# Patient Record
Sex: Female | Born: 1970
Health system: Southern US, Community
[De-identification: ages and names within clinical notes are randomized; demographics above are authoritative.]

## PROBLEM LIST (undated history)

## (undated) HISTORY — PX: KNEE SURGERY: SHX244

## (undated) HISTORY — PX: WRIST SURGERY: SHX841

## (undated) HISTORY — PX: KNEE ARTHROSCOPY W/ ACL RECONSTRUCTION: SHX1858

---

## 2003-12-28 ENCOUNTER — Emergency Department (HOSPITAL_COMMUNITY): Admission: EM | Admit: 2003-12-28 | Discharge: 2003-12-28 | Payer: Self-pay | Admitting: Emergency Medicine

## 2003-12-29 ENCOUNTER — Ambulatory Visit (HOSPITAL_COMMUNITY): Admission: RE | Admit: 2003-12-29 | Discharge: 2003-12-29 | Payer: Self-pay | Admitting: Orthopedic Surgery

## 2003-12-31 ENCOUNTER — Ambulatory Visit (HOSPITAL_BASED_OUTPATIENT_CLINIC_OR_DEPARTMENT_OTHER): Admission: RE | Admit: 2003-12-31 | Discharge: 2003-12-31 | Payer: Self-pay | Admitting: Orthopedic Surgery

## 2004-04-28 ENCOUNTER — Ambulatory Visit (HOSPITAL_BASED_OUTPATIENT_CLINIC_OR_DEPARTMENT_OTHER): Admission: RE | Admit: 2004-04-28 | Discharge: 2004-04-28 | Payer: Self-pay | Admitting: Orthopedic Surgery

## 2004-05-26 ENCOUNTER — Ambulatory Visit (HOSPITAL_BASED_OUTPATIENT_CLINIC_OR_DEPARTMENT_OTHER): Admission: RE | Admit: 2004-05-26 | Discharge: 2004-05-26 | Payer: Self-pay | Admitting: Orthopedic Surgery

## 2005-08-24 ENCOUNTER — Encounter: Admission: RE | Admit: 2005-08-24 | Discharge: 2005-08-24 | Payer: Self-pay | Admitting: Specialist

## 2005-09-24 ENCOUNTER — Encounter: Admission: RE | Admit: 2005-09-24 | Discharge: 2005-11-06 | Payer: Self-pay | Admitting: Specialist

## 2005-12-24 ENCOUNTER — Ambulatory Visit (HOSPITAL_COMMUNITY): Admission: RE | Admit: 2005-12-24 | Discharge: 2005-12-24 | Payer: Self-pay | Admitting: Specialist

## 2012-11-17 ENCOUNTER — Other Ambulatory Visit: Payer: Self-pay | Admitting: Nurse Practitioner

## 2012-11-17 MED ORDER — SCOPOLAMINE 1 MG/3DAYS TD PT72: 1.0000 | MEDICATED_PATCH | TRANSDERMAL | Status: DC

## 2013-06-29 ENCOUNTER — Ambulatory Visit (INDEPENDENT_AMBULATORY_CARE_PROVIDER_SITE_OTHER): Payer: BC Managed Care – PPO | Admitting: Family Medicine

## 2013-06-29 ENCOUNTER — Encounter: Payer: Self-pay | Admitting: Family Medicine

## 2013-06-29 ENCOUNTER — Ambulatory Visit (INDEPENDENT_AMBULATORY_CARE_PROVIDER_SITE_OTHER): Payer: BC Managed Care – PPO

## 2013-06-29 VITALS — BP 113/75 | HR 94 | Temp 96.3°F | Ht 63.25 in | Wt 167.4 lb

## 2013-06-29 DIAGNOSIS — M25579 Pain in unspecified ankle and joints of unspecified foot: Secondary | ICD-10-CM

## 2013-06-29 MED ORDER — NAPROXEN 500 MG PO TABS: 500.0000 mg | ORAL_TABLET | Freq: Two times a day (BID) | ORAL | Status: DC

## 2013-06-29 NOTE — Progress Notes (Signed)
   Subjective:    Patient ID: Jamie Odonnell, female    DOB: 1970-03-16, 43 y.o.   MRN: [id number]  HPI This 43 y.o. female presents for evaluation of left injury which occurred 3 days ago when she  Was jogging and turned her foot in and she heard a pop and then had pain and difficulty walking On her left foot.  She has swelling and tenderness in her foot.   Review of Systems C/o left foot pain   No chest pain, SOB, HA, dizziness, vision change, N/V, diarrhea, constipation, dysuria, urinary urgency or frequency, myalgias, arthralgias or rash.  Objective:   Physical Exam  Left Foot - Pedal edema and TTP over 5th metatarsal and lateral anterior foot.  Mildly limited Dorsiflexion and plantar flexion, eversion, and inversion intact.  Patient ambulates w/o difficulty.      Xray of left foot -  No fracture Assessment & Plan:  Pain in joint, ankle and foot - Plan: DG Foot Complete Left, naproxen (NAPROSYN) 500 MG tablet Po bid x 10 days.   Follow up if not better.  Discussed probably just sprain or tendonitis.  Deatra CanterWilliam J Oxford FNP

## 2014-06-09 ENCOUNTER — Telehealth: Payer: Self-pay | Admitting: Family Medicine

## 2014-06-09 MED ORDER — SCOPOLAMINE 1 MG/3DAYS TD PT72: 1.0000 | MEDICATED_PATCH | TRANSDERMAL | Status: DC

## 2014-06-09 NOTE — Telephone Encounter (Signed)
Last seen by Ander SladeBill Oxford 06/2013. Would you authorize a prescription, does she need to be seen, or would you suggest OTC med?

## 2014-06-28 ENCOUNTER — Ambulatory Visit (INDEPENDENT_AMBULATORY_CARE_PROVIDER_SITE_OTHER): Payer: BLUE CROSS/BLUE SHIELD | Admitting: Family

## 2014-06-28 ENCOUNTER — Encounter: Payer: Self-pay | Admitting: Family

## 2014-06-28 VITALS — BP 130/84 | HR 73 | Temp 98.1°F | Ht 63.25 in | Wt 176.4 lb

## 2014-06-28 DIAGNOSIS — R05 Cough: Secondary | ICD-10-CM | POA: Diagnosis not present

## 2014-06-28 DIAGNOSIS — J309 Allergic rhinitis, unspecified: Secondary | ICD-10-CM | POA: Diagnosis not present

## 2014-06-28 DIAGNOSIS — R059 Cough, unspecified: Secondary | ICD-10-CM

## 2014-06-28 MED ORDER — BENZONATATE 200 MG PO CAPS: 200.0000 mg | ORAL_CAPSULE | Freq: Three times a day (TID) | ORAL | Status: DC | PRN

## 2014-06-28 MED ORDER — LORATADINE 10 MG PO TABS: 10.0000 mg | ORAL_TABLET | Freq: Every day | ORAL | Status: DC

## 2014-06-28 NOTE — Progress Notes (Signed)
Subjective:    Patient ID: Jamie Odonnell, female    DOB: 06-02-1970, 44 y.o.   MRN: [id number]  Cough Associated symptoms include shortness of breath. Pertinent negatives include no chills, ear pain, headaches or sore throat.  Sinusitis This is a new problem. The current episode started more than 1 month ago. The problem is unchanged. There has been no fever. Her pain is at a severity of 4/10. The pain is mild. Associated symptoms include congestion, coughing, a hoarse voice, shortness of breath and sneezing. Pertinent negatives include no chills, ear pain, headaches, sinus pressure or sore throat. Past treatments include oral decongestants (Zytec). The treatment provided mild relief.      Review of Systems  Constitutional: Negative.  Negative for chills.  HENT: Positive for congestion, hoarse voice and sneezing. Negative for ear pain, sinus pressure and sore throat.   Eyes: Negative.   Respiratory: Positive for cough and shortness of breath.   Cardiovascular: Negative.  Negative for palpitations.  Gastrointestinal: Negative.   Endocrine: Negative.   Genitourinary: Negative.   Musculoskeletal: Negative.   Neurological: Negative.  Negative for headaches.  Hematological: Negative.   Psychiatric/Behavioral: Negative.   All other systems reviewed and are negative.      Objective:   Physical Exam  Constitutional: She is oriented to person, place, and time. She appears well-developed and well-nourished. No distress.  HENT:  Head: Normocephalic and atraumatic.  Right Ear: External ear normal.  Left Ear: External ear normal.  Nasal passage erythemas with mild swelling    Eyes: Pupils are equal, round, and reactive to light.  Neck: Normal range of motion. Neck supple. No thyromegaly present.  Cardiovascular: Normal rate, regular rhythm, normal heart sounds and intact distal pulses.   No murmur heard. Pulmonary/Chest: Effort normal and breath sounds normal. No respiratory distress.  She has no wheezes.  Abdominal: Soft. Bowel sounds are normal. She exhibits no distension. There is no tenderness.  Musculoskeletal: Normal range of motion. She exhibits no edema or tenderness.  Neurological: She is alert and oriented to person, place, and time. She has normal reflexes. No cranial nerve deficit.  Skin: Skin is warm and dry.  Psychiatric: She has a normal mood and affect. Her behavior is normal. Judgment and thought content normal.  Vitals reviewed.     BP 130/84 mmHg  Pulse 73  Temp(Src) 98.1 F (36.7 C) (Oral)  Ht 5' 3.25" (1.607 m)  Wt 176 lb 6.4 oz (80.015 kg)  BMI 30.98 kg/m2  LMP 06/01/2014     Assessment & Plan:  1. Allergic rhinitis, unspecified allergic rhinitis type -- Take meds as prescribed - Use a cool mist humidifier  -Use saline nose sprays frequently -Saline irrigations of the nose can be very helpful if done frequently.  * 4X daily for 1 week*  * Use of a nettie pot can be helpful with this. Follow directions with this* -Force fluids -For any cough or congestion  Use plain Mucinex- regular strength or max strength is fine   * Children- consult with Pharmacist for dosing -For fever or aces or pains- take tylenol or ibuprofen appropriate for age and weight.  * for fevers greater than 101 orally you may alternate ibuprofen and tylenol every  3 hours. -Throat lozenges if help - loratadine (CLARITIN) 10 MG tablet; Take 1 tablet (10 mg total) by mouth daily.  Dispense: 90 tablet; Refill: 1  2. Cough - benzonatate (TESSALON) 200 MG capsule; Take 1 capsule (200 mg total) by mouth  3 (three) times daily as needed.  Dispense: 30 capsule; Refill: 1  Jannifer Rodney, FNP

## 2014-06-28 NOTE — Patient Instructions (Signed)
Allergic Rhinitis Allergic rhinitis is when the mucous membranes in the nose respond to allergens. Allergens are particles in the air that cause your body to have an allergic reaction. This causes you to release allergic antibodies. Through a chain of events, these eventually cause you to release histamine into the blood stream. Although meant to protect the body, it is this release of histamine that causes your discomfort, such as frequent sneezing, congestion, and an itchy, runny nose.  CAUSES  Seasonal allergic rhinitis (hay fever) is caused by pollen allergens that may come from grasses, trees, and weeds. Year-round allergic rhinitis (perennial allergic rhinitis) is caused by allergens such as house dust mites, pet dander, and mold spores.  SYMPTOMS   Nasal stuffiness (congestion).  Itchy, runny nose with sneezing and tearing of the eyes. DIAGNOSIS  Your health care provider can help you determine the allergen or allergens that trigger your symptoms. If you and your health care provider are unable to determine the allergen, skin or blood testing may be used. TREATMENT  Allergic rhinitis does not have a cure, but it can be controlled by:  Medicines and allergy shots (immunotherapy).  Avoiding the allergen. Hay fever may often be treated with antihistamines in pill or nasal spray forms. Antihistamines block the effects of histamine. There are over-the-counter medicines that may help with nasal congestion and swelling around the eyes. Check with your health care provider before taking or giving this medicine.  If avoiding the allergen or the medicine prescribed do not work, there are many new medicines your health care provider can prescribe. Stronger medicine may be used if initial measures are ineffective. Desensitizing injections can be used if medicine and avoidance does not work. Desensitization is when a patient is given ongoing shots until the body becomes less sensitive to the allergen.  Make sure you follow up with your health care provider if problems continue. HOME CARE INSTRUCTIONS It is not possible to completely avoid allergens, but you can reduce your symptoms by taking steps to limit your exposure to them. It helps to know exactly what you are allergic to so that you can avoid your specific triggers. SEEK MEDICAL CARE IF:   You have a fever.  You develop a cough that does not stop easily (persistent).  You have shortness of breath.  You start wheezing.  Symptoms interfere with normal daily activities. Document Released: 11/21/2000 Document Revised: 03/03/2013 Document Reviewed: 11/03/2012 Exit[employer] Patient Information 2015 [employer]. This information is not intended to replace advice given to you by your health care provider. Make sure you discuss any questions you have with your health care provider.  - Take meds as prescribed - Use a cool mist humidifier  -Use saline nose sprays frequently -Saline irrigations of the nose can be very helpful if done frequently.  * 4X daily for 1 week*  * Use of a nettie pot can be helpful with this. Follow directions with this* -Force fluids -For any cough or congestion  Use plain Mucinex- regular strength or max strength is fine   * Children- consult with Pharmacist for dosing -For fever or aces or pains- take tylenol or ibuprofen appropriate for age and weight.  * for fevers greater than 101 orally you may alternate ibuprofen and tylenol every  3 hours. -Throat lozenges if help   Micaylah Bertucci, FNP  

## 2015-05-12 ENCOUNTER — Ambulatory Visit: Payer: BLUE CROSS/BLUE SHIELD | Admitting: Family Medicine

## 2015-05-13 ENCOUNTER — Encounter: Payer: Self-pay | Admitting: Family Medicine

## 2015-11-16 ENCOUNTER — Encounter: Payer: Self-pay | Admitting: Family Medicine

## 2015-11-16 ENCOUNTER — Ambulatory Visit (INDEPENDENT_AMBULATORY_CARE_PROVIDER_SITE_OTHER): Payer: BLUE CROSS/BLUE SHIELD | Admitting: Family Medicine

## 2015-11-16 DIAGNOSIS — S90869A Insect bite (nonvenomous), unspecified foot, initial encounter: Secondary | ICD-10-CM | POA: Insufficient documentation

## 2015-11-16 DIAGNOSIS — W57XXXA Bitten or stung by nonvenomous insect and other nonvenomous arthropods, initial encounter: Secondary | ICD-10-CM | POA: Diagnosis not present

## 2015-11-16 DIAGNOSIS — S90862A Insect bite (nonvenomous), left foot, initial encounter: Secondary | ICD-10-CM

## 2015-11-16 MED ORDER — FLUOCINONIDE-E 0.05 % EX CREA: 1.0000 "application " | TOPICAL_CREAM | Freq: Two times a day (BID) | CUTANEOUS | 5 refills | Status: DC

## 2015-11-16 MED ORDER — DOXYCYCLINE HYCLATE 100 MG PO CAPS: 100.0000 mg | ORAL_CAPSULE | Freq: Two times a day (BID) | ORAL | 0 refills | Status: DC

## 2015-11-16 NOTE — Progress Notes (Signed)
Subjective:  Patient ID: Jamie Odonnell, female    DOB: 1970/04/22  Age: 45 y.o. MRN: [id number]  CC: Insect Bite (pt here today b/c of 2 insect bites on left ankle and left foot that is reddened, raised and itches)   HPI Jamie Odonnell presents for First noticed 2 lesions on the left foot 6 days ago. They were pruritic. After scratching they seem to grow. There were tender but not painful. Her main concern is that they aren't infected and although they are somewhat better than they were a day or 2 ago these are still rather large on her left foot. She denies any fever chills or sweats.   History Jamie PikesSusan has no past medical history on file.   She has a past surgical history that includes Cesarean section; Knee surgery (Left); Knee arthroscopy w/ ACL reconstruction (Left); and Wrist surgery.   Her family history is not on file.She reports that she has never smoked. She has never used smokeless tobacco. She reports that she does not drink alcohol or use drugs.    ROS Review of Systems  Constitutional: Negative for appetite change, chills, diaphoresis, fatigue and fever.  HENT: Negative for congestion and sore throat.   Respiratory: Negative for cough.   Cardiovascular: Negative for palpitations.  Gastrointestinal: Negative for abdominal pain.  Musculoskeletal: Negative for arthralgias.  Skin: Positive for rash.    Objective:  BP 120/69   Pulse 64   Temp 97.7 F (36.5 C) (Oral)   Ht 5' 3.25" (1.607 m)   Wt 175 lb 6 oz (79.5 kg)   BMI 30.82 kg/m   BP Readings from Last 3 Encounters:  11/16/15 120/69  06/28/14 130/84  06/29/13 113/75    Wt Readings from Last 3 Encounters:  11/16/15 175 lb 6 oz (79.5 kg)  06/28/14 176 lb 6.4 oz (80 kg)  06/29/13 167 lb 6.4 oz (75.9 kg)     Physical Exam  Constitutional: She is oriented to person, place, and time. She appears well-developed and well-nourished. No distress.  HENT:  Head: Normocephalic.  Neck: Normal range of motion.    Cardiovascular: Normal rate and regular rhythm.   Neurological: She is alert and oriented to person, place, and time.  Skin: Skin is warm and dry. Rash (2 separate lesi the left foot one close to the ankle is 4 cm in diameter. One in the dorsal lateral midfoot is 3-1/2 cm. No thorough violaceous. There is a central bleb. There is blanching.) noted.  Psychiatric: She has a normal mood and affect.     No results found for: WBC, HGB, HCT, PLT, GLUCOSE, CHOL, TRIG, HDL, LDLDIRECT, LDLCALC, ALT, AST, NA, K, CL, CREATININE, BUN, CO2, TSH, PSA, INR, GLUF, HGBA1C, MICROALBUR  No results found.  Assessment & Plan:   Jamie PikesSusan was seen today for insect bite.  Diagnoses and all orders for this visit:  Insect bite of foot with local reaction, left, initial encounter  Other orders -     doxycycline (VIBRAMYCIN) 100 MG capsule; Take 1 capsule (100 mg total) by mouth 2 (two) times daily. -     fluocinonide-emollient (LIDEX-E) 0.05 % cream; Apply 1 application topically 2 (two) times daily. To affected areas      I have discontinued Ms. Mcculloh's norgestimate-ethinyl estradiol, Norethindrone-Ethinyl Estradiol-Fe Biphas, and benzonatate. I am also having her start on doxycycline and fluocinonide-emollient. Additionally, I am having her maintain her loratadine and JUNEL 1/20.  Meds ordered this encounter  Medications  . JUNEL 1/20  1-20 MG-MCG tablet  . doxycycline (VIBRAMYCIN) 100 MG capsule    Sig: Take 1 capsule (100 mg total) by mouth 2 (two) times daily.    Dispense:  10 capsule    Refill:  0  . fluocinonide-emollient (LIDEX-E) 0.05 % cream    Sig: Apply 1 application topically 2 (two) times daily. To affected areas    Dispense:  60 g    Refill:  5     Follow-up: No Follow-up on file.  Mechele Claude, M.D.

## 2015-12-24 ENCOUNTER — Ambulatory Visit (INDEPENDENT_AMBULATORY_CARE_PROVIDER_SITE_OTHER): Payer: BLUE CROSS/BLUE SHIELD | Admitting: Family Medicine

## 2015-12-24 ENCOUNTER — Encounter: Payer: Self-pay | Admitting: Family Medicine

## 2015-12-24 VITALS — BP 138/88 | HR 86 | Temp 97.5°F | Ht 63.25 in | Wt 175.2 lb

## 2015-12-24 DIAGNOSIS — J209 Acute bronchitis, unspecified: Secondary | ICD-10-CM

## 2015-12-24 DIAGNOSIS — R059 Cough, unspecified: Secondary | ICD-10-CM

## 2015-12-24 DIAGNOSIS — R05 Cough: Secondary | ICD-10-CM | POA: Diagnosis not present

## 2015-12-24 MED ORDER — AZITHROMYCIN 250 MG PO TABS: ORAL_TABLET | ORAL | 0 refills | Status: DC

## 2015-12-24 MED ORDER — GUAIFENESIN-CODEINE 100-10 MG/5ML PO SOLN: 5.0000 mL | Freq: Three times a day (TID) | ORAL | 0 refills | Status: DC | PRN

## 2015-12-24 NOTE — Progress Notes (Signed)
   HPI  Patient presents today here is cough.  Patient describes dryness and cough for about 2.5 weeks.  She describes dry cough,throat irritation, however no congestion, fever, or chest pain.  She has had shortness of breathe with activity and with coughing fits. She has had mostly reasonable nighttime cough, however last night she could not sleep due to the cough.  She feels that she's pulled a muscle because of coughing so severely.   PMH: Smoking status noted ROS: Per HPI  Objective: BP 138/88   Pulse 86   Temp 97.5 F (36.4 C) (Oral)   Ht 5' 3.25" (1.607 m)   Wt 175 lb 4 oz (79.5 kg)   SpO2 99%   BMI 30.80 kg/m  Gen: NAD, alert, cooperative with exam HEENT: NCAT, Mary's with mild swelling and erythema, TMs normaly, oropharynx clear Neck: No tender lymphadenopathy CV: RRR, good S1/S2, no murmur Resp: nonlabored, good air movement, overall clear but very wheezy cough Abd: SNTND, BS present, no guarding or organomegaly Ext: No edema, warm Neuro: Alert and oriented, No gross deficits  Assessment and plan:  # acute bronchitis Treatment azithromycin, IM Depo-Medrol, cough syrup with codeine for nighttime cough No persistent wheezing so did not give albuterol Return to clinic with any worsening symptoms or failure to improve   Meds ordered this encounter  Medications  . azithromycin (ZITHROMAX) 250 MG tablet    Sig: Take 2 tablets on day 1 and 1 tablet daily after that    Dispense:  6 tablet    Refill:  0  . guaiFENesin-codeine 100-10 MG/5ML syrup    Sig: Take 5-10 mLs by mouth 3 (three) times daily as needed for cough.    Dispense:  120 mL    Refill:  0    Murtis SinkSam Bradshaw, MD [facility] 12/24/2015, 9:17 AM

## 2015-12-24 NOTE — Patient Instructions (Signed)
Great to meet you!  Please be sure to finish all antibiotics   Acute Bronchitis Bronchitis is when the airways that extend from the windpipe into the lungs get red, puffy, and painful (inflamed). Bronchitis often causes thick spit (mucus) to develop. This leads to a cough. A cough is the most common symptom of bronchitis. In acute bronchitis, the condition usually begins suddenly and goes away over time (usually in 2 weeks). Smoking, allergies, and asthma can make bronchitis worse. Repeated episodes of bronchitis may cause more lung problems. HOME CARE  Rest.  Drink enough fluids to keep your pee (urine) clear or pale yellow (unless you need to limit fluids as told by your doctor).  Only take over-the-counter or prescription medicines as told by your doctor.  Avoid smoking and secondhand smoke. These can make bronchitis worse. If you are a smoker, think about using nicotine gum or skin patches. Quitting smoking will help your lungs heal faster.  Reduce the chance of getting bronchitis again by:  Washing your hands often.  Avoiding people with cold symptoms.  Trying not to touch your hands to your mouth, nose, or eyes.  Follow up with your doctor as told. GET HELP IF: Your symptoms do not improve after 1 week of treatment. Symptoms include:  Cough.  Fever.  Coughing up thick spit.  Body aches.  Chest congestion.  Chills.  Shortness of breath.  Sore throat. GET HELP RIGHT AWAY IF:   You have an increased fever.  You have chills.  You have severe shortness of breath.  You have bloody thick spit (sputum).  You throw up (vomit) often.  You lose too much body fluid (dehydration).  You have a severe headache.  You faint. MAKE SURE YOU:   Understand these instructions.  Will watch your condition.  Will get help right away if you are not doing well or get worse.   This information is not intended to replace advice given to you by your health care provider.  Make sure you discuss any questions you have with your health care provider.   Document Released: 08/15/2007 Document Revised: 10/29/2012 Document Reviewed: 08/19/2012 Elsevier Interactive Patient Education [employer]2016 Elsevier Inc.

## 2015-12-26 MED ORDER — METHYLPREDNISOLONE ACETATE 80 MG/ML IJ SUSP
80.0000 mg | Freq: Once | INTRAMUSCULAR | Status: AC
  Administered 2015-12-24: 80 mg via INTRAMUSCULAR

## 2015-12-26 NOTE — Addendum Note (Signed)
Addended by: Quay BurowPOTTER, Janelly Switalski K on: 12/26/2015 08:29 AM   Modules accepted: Orders

## 2017-06-17 ENCOUNTER — Ambulatory Visit (INDEPENDENT_AMBULATORY_CARE_PROVIDER_SITE_OTHER): Payer: BLUE CROSS/BLUE SHIELD | Admitting: Family Medicine

## 2017-06-17 ENCOUNTER — Encounter: Payer: Self-pay | Admitting: Family Medicine

## 2017-06-17 VITALS — BP 120/82 | HR 63 | Temp 97.8°F | Ht 63.25 in | Wt 181.6 lb

## 2017-06-17 DIAGNOSIS — T753XXA Motion sickness, initial encounter: Secondary | ICD-10-CM | POA: Diagnosis not present

## 2017-06-17 MED ORDER — SCOPOLAMINE 1 MG/3DAYS TD PT72: 1.0000 | MEDICATED_PATCH | TRANSDERMAL | 2 refills | Status: DC

## 2017-06-17 NOTE — Progress Notes (Signed)
   HPI  Patient presents today Motion sickness.  Patient states that [place].  She has history of being very motion sick.  She is done well with scopolamine patch previously.  Patient has a GYN who performs Pap smears and mammograms.  PMH: Smoking status noted ROS: Per HPI  Objective: BP 120/82   Pulse 63   Temp 97.8 F (36.6 C) (Oral)   Ht 5' 3.25" (1.607 m)   Wt 181 lb 9.6 oz (82.4 kg)   BMI 31.92 kg/m  Gen: NAD, alert, cooperative with exam HEENT: NCAT CV: RRR, good S1/S2, no murmur Resp: CTABL, no wheezes, non-labored Ext: No edema, warm Neuro: Alert and oriented, No gross deficits  Assessment and plan:  #Motion sickness Scopolamine patch prescribed Discussed usual use, return to clinic with any concerns     Meds ordered this encounter  Medications  . scopolamine (TRANSDERM-SCOP, 1.5 MG,) 1 MG/3DAYS    Sig: Place 1 patch (1.5 mg total) onto the skin every 3 (three) days.    Dispense:  3 patch    Refill:  2    Murtis SinkSam Ernie Sagrero, MD [facility] 06/17/2017, 8:21 AM

## 2017-07-16 DIAGNOSIS — Z85828 Personal history of other malignant neoplasm of skin: Secondary | ICD-10-CM | POA: Diagnosis not present

## 2017-07-16 DIAGNOSIS — D485 Neoplasm of uncertain behavior of skin: Secondary | ICD-10-CM | POA: Diagnosis not present

## 2017-07-16 DIAGNOSIS — L814 Other melanin hyperpigmentation: Secondary | ICD-10-CM | POA: Diagnosis not present

## 2017-07-16 DIAGNOSIS — L821 Other seborrheic keratosis: Secondary | ICD-10-CM | POA: Diagnosis not present

## 2017-07-16 DIAGNOSIS — D225 Melanocytic nevi of trunk: Secondary | ICD-10-CM | POA: Diagnosis not present

## 2017-11-05 ENCOUNTER — Encounter: Payer: Self-pay | Admitting: Family Medicine

## 2017-11-05 ENCOUNTER — Ambulatory Visit: Payer: BLUE CROSS/BLUE SHIELD | Admitting: Family Medicine

## 2017-11-05 VITALS — BP 147/97 | HR 82 | Temp 98.3°F | Ht 63.25 in | Wt 183.0 lb

## 2017-11-05 DIAGNOSIS — J020 Streptococcal pharyngitis: Secondary | ICD-10-CM | POA: Diagnosis not present

## 2017-11-05 LAB — RAPID STREP SCREEN (MED CTR MEBANE ONLY): Strep Gp A Ag, IA W/Reflex: POSITIVE — AB

## 2017-11-05 MED ORDER — AMOXICILLIN 500 MG PO CAPS: 500.0000 mg | ORAL_CAPSULE | Freq: Two times a day (BID) | ORAL | 0 refills | Status: AC

## 2017-11-05 NOTE — Progress Notes (Signed)
Subjective: CC: URi PCP: Elenora GammaBradshaw, Samuel L, MD XBJ:YNWGNHPI:Jamie Odonnell is a 47 y.o. female presenting to clinic today for:  1. URI Patient reports onset of symptoms Sunday evening.  She describes a severe sore throat and intermittent dry cough.  She reports subjective fevers and chills on Sunday.  Denies any nausea, vomiting, diarrhea, rash.  She used NyQuil yesterday with good sleep.  She has not tried any salt water gargles, sore throat sprays or oral NSAIDs.  No known sick contacts.   ROS: Per HPI  Allergies  Allergen Reactions  . Ciprofloxacin Itching   History reviewed. No pertinent past medical history.  Current Outpatient Medications:  .  JUNEL 1/20 1-20 MG-MCG tablet, , Disp: , Rfl:  .  scopolamine (TRANSDERM-SCOP, 1.5 MG,) 1 MG/3DAYS, Place 1 patch (1.5 mg total) onto the skin every 3 (three) days., Disp: 3 patch, Rfl: 2 Social History   Socioeconomic History  . Marital status: Married    Spouse name: Not on file  . Number of children: Not on file  . Years of education: Not on file  . Highest education level: Not on file  Occupational History  . Not on file  Social Needs  . Financial resource strain: Not on file  . Food insecurity:    Worry: Not on file    Inability: Not on file  . Transportation needs:    Medical: Not on file    Non-medical: Not on file  Tobacco Use  . Smoking status: Never Smoker  . Smokeless tobacco: Never Used  Substance and Sexual Activity  . Alcohol use: No    Comment: rare  . Drug use: No  . Sexual activity: Not on file  Lifestyle  . Physical activity:    Days per week: Not on file    Minutes per session: Not on file  . Stress: Not on file  Relationships  . Social connections:    Talks on phone: Not on file    Gets together: Not on file    Attends religious service: Not on file    Active member of club or organization: Not on file    Attends meetings of clubs or organizations: Not on file    Relationship status: Not on file  .  Intimate partner violence:    Fear of current or ex partner: Not on file    Emotionally abused: Not on file    Physically abused: Not on file    Forced sexual activity: Not on file  Other Topics Concern  . Not on file  Social History Narrative  . Not on file   History reviewed. No pertinent family history.  Objective: Office vital signs reviewed. BP (!) 146/96   Pulse 85   Temp 98.3 F (36.8 C) (Oral)   Ht 5' 3.25" (1.607 m)   Wt 183 lb (83 kg)   BMI 32.16 kg/m   Physical Examination:  General: Awake, alert, well nourished, No acute distress HEENT: Normal    Neck: No masses palpated. No lymphadenopathy    Ears: Tympanic membranes intact, normal light reflex, no erythema, no bulging    Eyes: PERRLA, extraocular membranes intact, sclera white    Nose: nasal turbinates moist, no nasal discharge    Throat: moist mucus membranes, mild oropharyngeal erythema, no tonsillar exudate.  Airway is patent Cardio: regular rate and rhythm, S1S2 heard, no murmurs appreciated Pulm: clear to auscultation bilaterally, no wheezes, rhonchi or rales; normal work of breathing on room air  Assessment/  Plan: 47 y.o. female   1. Strep pharyngitis Patient is afebrile nontoxic-appearing.  Physical exam was remarkable for oropharyngeal erythema.  Given severity of sore throat, rapid strep was obtained.  This was positive.  She was prescribed Amoxil and 500 mg p.o. twice daily for the next 10 days.  Salt water gargles, Chloraseptic spray recommended.  Okay to take oral NSAID if needed.  Reasons for return evaluation discussed.  Recommended that she remain out of work for the next 24 hours.  - Rapid Strep Screen ([facility] ONLY)   Orders Placed This Encounter  Procedures  . Rapid Strep Screen ([facility] ONLY)   Meds ordered this encounter  Medications  . amoxicillin (AMOXIL) 500 MG capsule    Sig: Take 1 capsule (500 mg total) by mouth 2 (two) times daily for 10 days.    Dispense:  20  capsule    Refill:  0     Ashly Hulen Skains, DO [facility] [phone]

## 2017-11-05 NOTE — Patient Instructions (Signed)
Your strep test was positive.  I prescribed you amoxicillin to take twice a day for the next 10 days.  I recommend that you stay out of work for 24 hours.  If symptoms worsen for any reason, you develop inability to stay hydrated, please seek immediate medical attention.   Strep Throat Strep throat is an infection of the throat. It is caused by germs. Strep throat spreads from person to person because of coughing, sneezing, or close contact. Follow these instructions at home: Medicines  Take over-the-counter and prescription medicines only as told by your doctor.  Take your antibiotic medicine as told by your doctor. Do not stop taking the medicine even if you feel better.  Have family members who also have a sore throat or fever go to a doctor. Eating and drinking  Do not share food, drinking cups, or personal items.  Try eating soft foods until your sore throat feels better.  Drink enough fluid to keep your pee (urine) clear or pale yellow. General instructions  Rinse your mouth (gargle) with a salt-water mixture 3-4 times per day or as needed. To make a salt-water mixture, stir -1 tsp of salt into 1 cup of warm water.  Make sure that all people in your house wash their hands well.  Rest.  Stay home from school or work until you have been taking antibiotics for 24 hours.  Keep all follow-up visits as told by your doctor. This is important. Contact a doctor if:  Your neck keeps getting bigger.  You get a rash, cough, or earache.  You cough up thick liquid that is green, yellow-brown, or bloody.  You have pain that does not get better with medicine.  Your problems get worse instead of getting better.  You have a fever. Get help right away if:  You throw up (vomit).  You get a very bad headache.  You neck hurts or it feels stiff.  You have chest pain or you are short of breath.  You have drooling, very bad throat pain, or changes in your voice.  Your neck is  swollen or the skin gets red and tender.  Your mouth is dry or you are peeing less than normal.  You keep feeling more tired or it is hard to wake up.  Your joints are red or they hurt. This information is not intended to replace advice given to you by your health care provider. Make sure you discuss any questions you have with your health care provider. Document Released: 08/15/2007 Document Revised: 10/26/2015 Document Reviewed: 06/21/2014 Elsevier Interactive Patient Education  [employer]2018 Elsevier Inc.

## 2017-11-08 ENCOUNTER — Telehealth: Payer: Self-pay | Admitting: Family Medicine

## 2017-11-08 NOTE — Telephone Encounter (Signed)
What symptoms do you have? Seen 11/05/17 [facility]-pt now has cough and congested pt thinks it may be bronchitis  How long have you been sick? 11/05/17  Have you been seen for this problem? 11/05/17  If your provider decides to give you a prescription, which pharmacy would you like for it to be sent to? Pt is on amoxicillin (AMOXIL) 500 MG capsule and wants to know if something else can be called in use [facility]   Patient informed that this information will be sent to the clinical staff for review and that they should receive a follow up call.

## 2017-11-08 NOTE — Telephone Encounter (Signed)
Sore throat has resolved. Overall symptoms are slowly improving but someone mentioned today that she may have bronchitis. This concerned her. She does have a cough and had one episode of producing phlegm.    Advised to continue amoxicillin. Symptoms should continue to improve. If they worsen at any point or if she develops a fever then she needs to f/u. She should increase water intake and can take plain Mucinex as directed on package. D/c nyquil and dayquil. Advised that we are closed for the Labor Day Holiday after this evening but someone is available by phone if necessary.   Patient stated understanding and agreement to plan.

## 2018-06-24 DIAGNOSIS — D485 Neoplasm of uncertain behavior of skin: Secondary | ICD-10-CM | POA: Diagnosis not present

## 2018-06-24 DIAGNOSIS — L819 Disorder of pigmentation, unspecified: Secondary | ICD-10-CM | POA: Diagnosis not present

## 2018-06-25 DIAGNOSIS — L439 Lichen planus, unspecified: Secondary | ICD-10-CM | POA: Diagnosis not present

## 2018-07-31 DIAGNOSIS — L820 Inflamed seborrheic keratosis: Secondary | ICD-10-CM | POA: Diagnosis not present

## 2018-07-31 DIAGNOSIS — L304 Erythema intertrigo: Secondary | ICD-10-CM | POA: Diagnosis not present

## 2018-07-31 DIAGNOSIS — D1801 Hemangioma of skin and subcutaneous tissue: Secondary | ICD-10-CM | POA: Diagnosis not present

## 2018-07-31 DIAGNOSIS — L821 Other seborrheic keratosis: Secondary | ICD-10-CM | POA: Diagnosis not present

## 2018-07-31 DIAGNOSIS — D225 Melanocytic nevi of trunk: Secondary | ICD-10-CM | POA: Diagnosis not present

## 2018-11-19 ENCOUNTER — Encounter: Payer: Self-pay | Admitting: Family Medicine

## 2018-11-19 ENCOUNTER — Ambulatory Visit (INDEPENDENT_AMBULATORY_CARE_PROVIDER_SITE_OTHER): Payer: BC Managed Care – PPO | Admitting: Family Medicine

## 2018-11-19 DIAGNOSIS — Z91038 Other insect allergy status: Secondary | ICD-10-CM

## 2018-11-19 DIAGNOSIS — B880 Other acariasis: Secondary | ICD-10-CM | POA: Diagnosis not present

## 2018-11-19 MED ORDER — PREDNISONE 10 MG (21) PO TBPK: ORAL_TABLET | ORAL | 0 refills | Status: DC

## 2018-11-19 MED ORDER — HYDROXYZINE HCL 50 MG PO TABS: 25.0000 mg | ORAL_TABLET | Freq: Three times a day (TID) | ORAL | 0 refills | Status: DC | PRN

## 2018-11-19 NOTE — Patient Instructions (Signed)
Continue the calamine lotion applied to the affected areas.  I have replaced your cortisone cream with an oral corticosteroid called prednisone.  You can take each daily dose as a single dose rather than splitting them up with meals.  We discussed that this could cause some difficulty with sleep but that the hydroxyzine, which I have given you for itching, should offset this.  Make sure that you are taking a daily non-drowsy antihistamine as well.   Insect Bite, Adult An insect bite can make your skin red, itchy, and swollen. Some insects can spread disease to people with a bite. However, most insect bites do not lead to disease, and most are not serious. What are the causes? Insects may bite for many reasons, including:  Hunger.  To defend themselves. Insects that bite include:  Spiders.  Mosquitoes.  Ticks.  Fleas.  Ants.  Flies.  Kissing bugs.  Chiggers. What are the signs or symptoms? Symptoms of this condition include:  Itching or pain in the bite area.  Redness and swelling in the bite area.  An open wound (skin ulcer). Symptoms often last for 2-4 days. In rare cases, a person may have a very bad allergic reaction (anaphylactic reaction) to a bite. Symptoms of an anaphylactic reaction may include:  Feeling warm in the face (flushed). Your face may turn red.  Itchy, red, swollen areas of skin (hives).  Swelling of the: ? Eyes. ? Lips. ? Face. ? Mouth. ? Tongue. ? Throat.  Trouble with any of these: ? Breathing. ? Talking. ? Swallowing.  Loud breathing (wheezing).  Feeling dizzy or light-headed.  Passing out (fainting).  Pain or cramps in your belly.  Throwing up (vomiting).  Watery poop (diarrhea). How is this treated? Treatment is usually not needed. Symptoms often go away on their own. When treatment is needed, it may involve:  Putting a cream or lotion on the bite area. This helps with itching.  Taking an antibiotic medicine. This  treatment is needed if the bite area gets infected.  Getting a tetanus shot, if you are not up to date on this vaccine.  Putting ice on the affected area.  Using medicines called antihistamines. This treatment may be needed if you have itching or an allergic reaction to the insect bite.  Giving yourself a shot of medicine (epinephrine) using an auto-injector "pen" if you have an anaphylactic reaction to a bite. Your doctor will teach you how to use this pen. Follow these instructions at home: Bite area care   Do not scratch the bite area.  Keep the bite area clean and dry.  Wash the bite area every day with soap and water as told by your doctor.  Check the bite area every day for signs of infection. Check for: ? Redness, swelling, or pain. ? Fluid or blood. ? Warmth. ? Pus or a bad smell. Managing pain, itching, and swelling   You may put any of these on the bite area as told by your doctor: ? A paste made of baking soda and water. ? Cortisone cream. ? Calamine lotion.  If told, put ice on the bite area. ? Put ice in a plastic bag. ? Place a towel between your skin and the bag. ? Leave the ice on for 20 minutes, 2-3 times a day. General instructions  Apply or take over-the-counter and prescription medicines only as told by your doctor.  If you were prescribed an antibiotic medicine, take or apply it as told by your  doctor. Do not stop using the antibiotic even if your condition improves.  Keep all follow-up visits as told by your doctor. This is important. How is this prevented? To help you have a lower risk of insect bites:  When you are outside, wear clothing that covers your arms and legs.  Use insect repellent. The best insect repellents contain one of these: ? DEET. ? Picaridin. ? Oil of lemon eucalyptus (OLE). ? IR3535.  Consider spraying your clothing with a pesticide called permethrin. Permethrin helps prevent insect bites. It works for several weeks and  for up to 5-6 clothing washes. Do not apply permethrin directly to the skin.  If your home windows do not have screens, think about putting some in.  If you will be sleeping in an area where there are mosquitoes, consider covering your sleeping area with a mosquito net. Contact a doctor if:  You have redness, swelling, or pain in the bite area.  You have fluid or blood coming from the bite area.  The bite area feels warm to the touch.  You have pus or a bad smell coming from the bite area.  You have a fever. Get help right away if:  You have joint pain.  You have a rash.  You feel more tired or sleepy than you normally do.  You have neck pain.  You have a headache.  You feel weaker than you normally do.  You have signs of an anaphylactic reaction. Signs may include: ? Feeling warm in the face. ? Itchy, red, swollen areas of skin. ? Swelling of your:  Eyes.  Lips.  Face.  Mouth.  Tongue.  Throat. ? Trouble with any of these:  Breathing.  Talking.  Swallowing. ? Loud breathing. ? Feeling dizzy or light-headed. ? Passing out. ? Pain or cramps in your belly. ? Throwing up. ? Watery poop. These symptoms may be an emergency. Do not wait to see if the symptoms will go away. Do this right away:  Use your auto-injector pen as you have been told.  Get medical help. Call your local emergency services (911 in the [place] Do not drive yourself to the hospital. Summary  An insect bite can make your skin red, itchy, and swollen.  Treatment is usually not needed. Symptoms often go away on their own.  Do not scratch the bite area. Keep it clean and dry.  Ice can help with pain and itching from the bite. This information is not intended to replace advice given to you by your health care provider. Make sure you discuss any questions you have with your health care provider. Document Released: 02/24/2000 Document Revised: 09/06/2017 Document Reviewed: 09/06/2017  Elsevier Patient Education  2020 [employer].

## 2018-11-19 NOTE — Progress Notes (Signed)
Telephone visit  Subjective: CC: chigger bites PCP: Jamie Norlander, DO JYN:WGNFA Jamie Odonnell is a 48 y.o. female calls for telephone consult today. Patient provides verbal consent for consult held via phone.  Location of patient: home Location of provider: Working remotely from home Others present for call: none  1. Chigger bites Patient reports that she was hiking recently and she has since developed bumps on bilateral lower extremities, upper extremities and and torso that is itchy.  She notes that her friend, who she was hiking with, went to the dermatologist that she had similar lesions and was diagnosed with chigger bites.  She has been applying OTC cortisone cream, calamine lotion and benadryl with little improvement in symptoms.  Denies purulence or fever. LMP: On continuous contraception.   ROS: Per HPI  Allergies  Allergen Reactions  . Ciprofloxacin Itching   No past medical history on file.  Current Outpatient Medications:  .  JUNEL 1/20 1-20 MG-MCG tablet, , Disp: , Rfl:  .  scopolamine (TRANSDERM-SCOP, 1.5 MG,) 1 MG/3DAYS, Place 1 patch (1.5 mg total) onto the skin every 3 (three) days., Disp: 3 patch, Rfl: 2  Assessment/ Plan: 48 y.o. female   1. Allergic reaction to insect bite Given extensive nature will proceed with oral corticosteroids rather than topicals.  I have also advised her to use a nondrowsy antihistamine during the day and may use hydroxyzine in the evening.  Caution sedation.  Continue calamine lotion.  Monitor for signs and symptoms of secondary bacterial infection.  Follow-up PRN - predniSONE (STERAPRED UNI-PAK 21 TAB) 10 MG (21) TBPK tablet; As directed x 6 days  Dispense: 21 tablet; Refill: 0 - hydrOXYzine (ATARAX/VISTARIL) 50 MG tablet; Take 0.5-1 tablets (25-50 mg total) by mouth every 8 (eight) hours as needed for itching.  Dispense: 30 tablet; Refill: 0  2. Chigger bites - predniSONE (STERAPRED UNI-PAK 21 TAB) 10 MG (21) TBPK tablet; As directed  x 6 days  Dispense: 21 tablet; Refill: 0 - hydrOXYzine (ATARAX/VISTARIL) 50 MG tablet; Take 0.5-1 tablets (25-50 mg total) by mouth every 8 (eight) hours as needed for itching.  Dispense: 30 tablet; Refill: 0   Start time: 1:18pm End time: 1:25pm  Total time spent on patient care (including telephone call/ virtual visit): 12 minutes  English, [place] [phone]

## 2019-04-17 ENCOUNTER — Other Ambulatory Visit: Payer: Self-pay

## 2019-04-17 ENCOUNTER — Encounter: Payer: Self-pay | Admitting: Family

## 2019-04-17 ENCOUNTER — Ambulatory Visit (INDEPENDENT_AMBULATORY_CARE_PROVIDER_SITE_OTHER): Payer: BC Managed Care – PPO

## 2019-04-17 ENCOUNTER — Ambulatory Visit: Payer: BC Managed Care – PPO | Admitting: Family

## 2019-04-17 VITALS — BP 125/78 | HR 79 | Temp 97.5°F | Ht 63.25 in | Wt 195.0 lb

## 2019-04-17 DIAGNOSIS — M7731 Calcaneal spur, right foot: Secondary | ICD-10-CM | POA: Diagnosis not present

## 2019-04-17 DIAGNOSIS — M722 Plantar fascial fibromatosis: Secondary | ICD-10-CM | POA: Diagnosis not present

## 2019-04-17 DIAGNOSIS — M79671 Pain in right foot: Secondary | ICD-10-CM | POA: Diagnosis not present

## 2019-04-17 MED ORDER — PREDNISONE 10 MG (21) PO TBPK: ORAL_TABLET | ORAL | 0 refills | Status: DC

## 2019-04-17 MED ORDER — DICLOFENAC SODIUM 75 MG PO TBEC: 75.0000 mg | DELAYED_RELEASE_TABLET | Freq: Two times a day (BID) | ORAL | 0 refills | Status: DC

## 2019-04-17 NOTE — Patient Instructions (Signed)
Plantar Fasciitis  Plantar fasciitis is a painful foot condition that affects the heel. It occurs when the band of tissue that connects the toes to the heel bone (plantar fascia) becomes irritated. This can happen as the result of exercising too much or doing other repetitive activities (overuse injury). The pain from plantar fasciitis can range from mild irritation to severe pain that makes it difficult to walk or move. The pain is usually worse in the morning after sleeping, or after sitting or lying down for a while. Pain may also be worse after long periods of walking or standing. What are the causes? This condition may be caused by:  Standing for long periods of time.  Wearing shoes that do not have good arch support.  Doing activities that put stress on joints (high-impact activities), including running, aerobics, and ballet.  Being overweight.  An abnormal way of walking (gait).  Tight muscles in the back of your lower leg (calf).  High arches in your feet.  Starting a new athletic activity. What are the signs or symptoms? The main symptom of this condition is heel pain. Pain may:  Be worse with first steps after a time of rest, especially in the morning after sleeping or after you have been sitting or lying down for a while.  Be worse after long periods of standing still.  Decrease after 30-45 minutes of activity, such as gentle walking. How is this diagnosed? This condition may be diagnosed based on your medical history and your symptoms. Your health care provider may ask questions about your activity level. Your health care provider will do a physical exam to check for:  A tender area on the bottom of your foot.  A high arch in your foot.  Pain when you move your foot.  Difficulty moving your foot. You may have imaging tests to confirm the diagnosis, such as:  X-rays.  Ultrasound.  MRI. How is this treated? Treatment for plantar fasciitis depends on how  severe your condition is. Treatment may include:  Rest, ice, applying pressure (compression), and raising the affected foot (elevation). This may be called RICE therapy. Your health care provider may recommend RICE therapy along with over-the-counter pain medicines to manage your pain.  Exercises to stretch your calves and your plantar fascia.  A splint that holds your foot in a stretched, upward position while you sleep (night splint).  Physical therapy to relieve symptoms and prevent problems in the future.  Injections of steroid medicine (cortisone) to relieve pain and inflammation.  Stimulating your plantar fascia with electrical impulses (extracorporeal shock wave therapy). This is usually the last treatment option before surgery.  Surgery, if other treatments have not worked after 12 months. Follow these instructions at home:  Managing pain, stiffness, and swelling  If directed, put ice on the painful area: ? Put ice in a plastic bag, or use a frozen bottle of water. ? Place a towel between your skin and the bag or bottle. ? Roll the bottom of your foot over the bag or bottle. ? Do this for 20 minutes, 2-3 times a day.  Wear athletic shoes that have air-sole or gel-sole cushions, or try wearing soft shoe inserts that are designed for plantar fasciitis.  Raise (elevate) your foot above the level of your heart while you are sitting or lying down. Activity  Avoid activities that cause pain. Ask your health care provider what activities are safe for you.  Do physical therapy exercises and stretches as told   by your health care provider.  Try activities and forms of exercise that are easier on your joints (low-impact). Examples include swimming, water aerobics, and biking. General instructions  Take over-the-counter and prescription medicines only as told by your health care provider.  Wear a night splint while sleeping, if told by your health care provider. Loosen the splint  if your toes tingle, become numb, or turn cold and blue.  Maintain a healthy weight, or work with your health care provider to lose weight as needed.  Keep all follow-up visits as told by your health care provider. This is important. Contact a health care provider if you:  Have symptoms that do not go away after caring for yourself at home.  Have pain that gets worse.  Have pain that affects your ability to move or do your daily activities. Summary  Plantar fasciitis is a painful foot condition that affects the heel. It occurs when the band of tissue that connects the toes to the heel bone (plantar fascia) becomes irritated.  The main symptom of this condition is heel pain that may be worse after exercising too much or standing still for a long time.  Treatment varies, but it usually starts with rest, ice, compression, and elevation (RICE therapy) and over-the-counter medicines to manage pain. This information is not intended to replace advice given to you by your health care provider. Make sure you discuss any questions you have with your health care provider. Document Revised: 02/08/2017 Document Reviewed: 12/24/2016 Elsevier Patient Education  2020 [employer] Inc[employer]  

## 2019-04-17 NOTE — Progress Notes (Signed)
Subjective:    Patient ID: Jamie Odonnell, female    DOB: 08/24/1970, 49 y.o.   MRN: [id number]  Chief Complaint  Patient presents with  . Foot Pain    right    Pt presents to the office today with right heel pain that goes into her arch. She reports the pain started last year, but then resolved and comes and goes. She reports last night at [facility] she felt the pain and has worsen.  Foot Pain This is a new problem. The current episode started more than 1 month ago. The problem occurs intermittently. The problem has been waxing and waning. Pertinent negatives include no change in bowel habit, chest pain, congestion, coughing, headaches, nausea, urinary symptoms, visual change, vomiting or weakness. The symptoms are aggravated by standing. She has tried NSAIDs for the symptoms. The treatment provided mild relief.      Review of Systems  HENT: Negative for congestion.   Respiratory: Negative for cough.   Cardiovascular: Negative for chest pain.  Gastrointestinal: Negative for change in bowel habit, nausea and vomiting.  Neurological: Negative for weakness and headaches.  All other systems reviewed and are negative.      Objective:   Physical Exam Vitals reviewed.  Constitutional:      General: She is not in acute distress.    Appearance: She is well-developed.  HENT:     Head: Normocephalic and atraumatic.     Right Ear: External ear normal.  Eyes:     Pupils: Pupils are equal, round, and reactive to light.  Neck:     Thyroid: No thyromegaly.  Cardiovascular:     Rate and Rhythm: Normal rate and regular rhythm.     Heart sounds: Normal heart sounds. No murmur.  Pulmonary:     Effort: Pulmonary effort is normal. No respiratory distress.     Breath sounds: Normal breath sounds. No wheezing.  Abdominal:     General: Bowel sounds are normal. There is no distension.     Palpations: Abdomen is soft.     Tenderness: There is no abdominal tenderness.  Musculoskeletal:      General: Tenderness (right heel pain with palpation ) present. Normal range of motion.     Cervical back: Normal range of motion and neck supple.  Skin:    General: Skin is warm and dry.  Neurological:     Mental Status: She is alert and oriented to person, place, and time.     Cranial Nerves: No cranial nerve deficit.     Deep Tendon Reflexes: Reflexes are normal and symmetric.  Psychiatric:        Behavior: Behavior normal.        Thought Content: Thought content normal.        Judgment: Judgment normal.     BP 125/78   Pulse 79   Temp (!) 97.5 F (36.4 C) (Temporal)   Ht 5' 3.25" (1.607 m)   Wt 195 lb (88.5 kg)   SpO2 99%   BMI 34.27 kg/m        Assessment & Plan:  Marielle Mantione Mickley comes in today with chief complaint of Foot Pain (right )   Diagnosis and orders addressed:  1. Right foot pain - diclofenac (VOLTAREN) 75 MG EC tablet; Take 1 tablet (75 mg total) by mouth 2 (two) times daily.  Dispense: 30 tablet; Refill: 0 - predniSONE (STERAPRED UNI-PAK 21 TAB) 10 MG (21) TBPK tablet; Use as directed  Dispense: 21 tablet; Refill:  0 - DG Foot Complete Right; Future  2. Plantar fasciitis Rest Good shoe support discussed Do not go barefoot No other NSAIDs while taking Voltaren RTO if symptoms worsen or do not improve  - diclofenac (VOLTAREN) 75 MG EC tablet; Take 1 tablet (75 mg total) by mouth 2 (two) times daily.  Dispense: 30 tablet; Refill: 0 - predniSONE (STERAPRED UNI-PAK 21 TAB) 10 MG (21) TBPK tablet; Use as directed  Dispense: 21 tablet; Refill: 0 - DG Foot Complete Right; Future   Jannifer Rodney, FNP

## 2019-08-07 ENCOUNTER — Telehealth: Payer: Self-pay | Admitting: Family Medicine

## 2019-08-07 NOTE — Telephone Encounter (Signed)
Faxed last office note to [facility]

## 2019-08-14 ENCOUNTER — Ambulatory Visit: Payer: BC Managed Care – PPO | Admitting: Nurse Practitioner

## 2020-10-10 DIAGNOSIS — S83282A Other tear of lateral meniscus, current injury, left knee, initial encounter: Secondary | ICD-10-CM | POA: Diagnosis not present

## 2021-02-07 DIAGNOSIS — M9902 Segmental and somatic dysfunction of thoracic region: Secondary | ICD-10-CM | POA: Diagnosis not present

## 2021-02-07 DIAGNOSIS — M7918 Myalgia, other site: Secondary | ICD-10-CM | POA: Diagnosis not present

## 2021-02-07 DIAGNOSIS — M9901 Segmental and somatic dysfunction of cervical region: Secondary | ICD-10-CM | POA: Diagnosis not present

## 2021-02-07 DIAGNOSIS — M1612 Unilateral primary osteoarthritis, left hip: Secondary | ICD-10-CM | POA: Diagnosis not present

## 2021-02-09 ENCOUNTER — Other Ambulatory Visit: Payer: Self-pay | Admitting: Chiropractic Medicine

## 2021-02-09 DIAGNOSIS — M25552 Pain in left hip: Secondary | ICD-10-CM

## 2021-02-09 DIAGNOSIS — G8929 Other chronic pain: Secondary | ICD-10-CM

## 2021-02-27 ENCOUNTER — Other Ambulatory Visit: Payer: Self-pay

## 2021-02-27 ENCOUNTER — Ambulatory Visit
Admission: RE | Admit: 2021-02-27 | Discharge: 2021-02-27 | Disposition: A | Discharge status: Home / Self Care | Payer: Self-pay | Source: Ambulatory Visit | Attending: Chiropractic Medicine | Admitting: Chiropractic Medicine

## 2021-02-27 DIAGNOSIS — M25552 Pain in left hip: Secondary | ICD-10-CM

## 2021-02-27 DIAGNOSIS — G8929 Other chronic pain: Secondary | ICD-10-CM

## 2021-02-27 DIAGNOSIS — R600 Localized edema: Secondary | ICD-10-CM | POA: Diagnosis not present

## 2021-02-27 MED ORDER — IOPAMIDOL (ISOVUE-M 300) INJECTION 61%
10.0000 mL | Freq: Once | INTRAMUSCULAR | Status: AC | PRN
  Administered 2021-02-27: 16:00:00 10 mL via INTRA_ARTICULAR

## 2021-03-22 DIAGNOSIS — M25552 Pain in left hip: Secondary | ICD-10-CM | POA: Diagnosis not present

## 2021-03-22 DIAGNOSIS — M25551 Pain in right hip: Secondary | ICD-10-CM | POA: Diagnosis not present

## 2021-04-12 DIAGNOSIS — M25552 Pain in left hip: Secondary | ICD-10-CM | POA: Diagnosis not present

## 2021-05-09 DIAGNOSIS — M25552 Pain in left hip: Secondary | ICD-10-CM | POA: Diagnosis not present

## 2021-08-15 ENCOUNTER — Ambulatory Visit: Payer: BC Managed Care – PPO | Admitting: Family

## 2021-08-15 ENCOUNTER — Encounter: Payer: Self-pay | Admitting: Family

## 2021-08-15 VITALS — BP 116/70 | HR 68 | Temp 97.8°F | Ht 64.0 in | Wt 193.2 lb

## 2021-08-15 DIAGNOSIS — T753XXA Motion sickness, initial encounter: Secondary | ICD-10-CM | POA: Diagnosis not present

## 2021-08-15 MED ORDER — ONDANSETRON HCL 4 MG PO TABS: 4.0000 mg | ORAL_TABLET | Freq: Three times a day (TID) | ORAL | 0 refills | Status: DC | PRN

## 2021-08-15 MED ORDER — SCOPOLAMINE 1 MG/3DAYS TD PT72: 1.0000 | MEDICATED_PATCH | TRANSDERMAL | 12 refills | Status: DC

## 2021-08-15 NOTE — Progress Notes (Signed)
   Subjective:    Patient ID: Jamie Odonnell, female    DOB: 04-May-1970, 51 y.o.   MRN: [id number]  Chief Complaint  Patient presents with   Medication Refill    Requesting medication for nausea for upcoming cruise    Medication Refill  Pt presents to the office today requesting motion sickness medication. She is going on a Cruise in a few weeks and has motion sickness. She has used the scopolamine patches in the past that work well.    Review of Systems  All other systems reviewed and are negative.     Objective:   Physical Exam Vitals reviewed.  Constitutional:      General: She is not in acute distress.    Appearance: She is well-developed.  HENT:     Head: Normocephalic and atraumatic.     Right Ear: Tympanic membrane normal.     Left Ear: Tympanic membrane normal.  Eyes:     Pupils: Pupils are equal, round, and reactive to light.  Neck:     Thyroid: No thyromegaly.  Cardiovascular:     Rate and Rhythm: Normal rate and regular rhythm.     Heart sounds: Normal heart sounds. No murmur heard. Pulmonary:     Effort: Pulmonary effort is normal. No respiratory distress.     Breath sounds: Normal breath sounds. No wheezing.  Abdominal:     General: Bowel sounds are normal. There is no distension.     Palpations: Abdomen is soft.     Tenderness: There is no abdominal tenderness.  Musculoskeletal:        General: No tenderness. Normal range of motion.     Cervical back: Normal range of motion and neck supple.  Skin:    General: Skin is warm and dry.  Neurological:     Mental Status: She is alert and oriented to person, place, and time.     Cranial Nerves: No cranial nerve deficit.     Deep Tendon Reflexes: Reflexes are normal and symmetric.  Psychiatric:        Behavior: Behavior normal.        Thought Content: Thought content normal.        Judgment: Judgment normal.      BP 116/70   Pulse 68   Temp 97.8 F (36.6 C)   Ht 5\' 4"  (1.626 m)   Wt 193 lb 3.2 oz  (87.6 kg)   SpO2 98%   BMI 33.16 kg/m       Assessment & Plan:  Jamie Odonnell comes in today with chief complaint of Medication Refill (Requesting medication for nausea for upcoming cruise)   Diagnosis and orders addressed:  1. Motion sickness, initial encounter Scopolamine patches ordered Limit alcohol with patches Zofran as needed  Follow up as needed  - scopolamine (TRANSDERM-SCOP) 1 MG/3DAYS; Place 1 patch (1.5 mg total) onto the skin every 3 (three) days.  Dispense: 10 patch; Refill: 12 - ondansetron (ZOFRAN) 4 MG tablet; Take 1 tablet (4 mg total) by mouth every 8 (eight) hours as needed for nausea or vomiting.  Dispense: 20 tablet; Refill: 0     Charm Barges, FNP

## 2021-08-15 NOTE — Patient Instructions (Signed)
Motion Sickness Motion sickness is an unpleasant, temporary feeling of dizziness and nausea that may occur when you are traveling in a moving vehicle. You may also have abdominal pain, sweating, and paleness. You may experience motion sickness while riding in a boat, car, airplane, or even an amusement park ride. What are the causes? This condition may be caused when part of the inner ear (semicircular canal) is stimulated too much. The two canals help you keep your balance by sending signals to your brain about movement. You can get motion sickness if: The vestibular system is stimulated too much from the motion of your body. Your brain gets conflicting signals from the various motion sensors in your body. The effects of motion sickness may be increased by stress, dehydration, other illnesses, or drinking too much alcohol. What increases the risk? This condition is more likely to develop in: Children who are 2-12 years old. Women, especially those who are pregnant or taking birth control pills. People who get migraine headaches. People who have inner ear disorders. People who take certain medicines. What are the signs or symptoms? Symptoms of this condition include: Nausea and vomiting. Dizziness. Paleness. Sweating. Difficulty concentrating. Abdominal pain. Being unsteady when walking. The symptoms of motion sickness usually get better after the motion or traveling stops, but symptoms may last for hours or days. How is this diagnosed? This condition is diagnosed based on a physical exam, your medical history, and your description of the symptoms that you have while traveling or moving. How is this treated? For most people, symptoms fade quickly after the motion stops. Avoiding certain things or using certain techniques before or during travel can help prevent episodes of motion sickness. Your health care provider may recommend or prescribe medicine to help prevent motion sickness.  Medicine may be in the form of a patch that is placed behind your ear. Follow these instructions at home: Medicines Take or use over-the-counter and prescription medicines only as told by your health care provider. If you use a motion sickness patch, wash your hands right after you put the patch on. Touching your hands to your eyes after using the patch can enlarge (dilate) your pupils for 1-2 days and disturb your vision. Eating and drinking  Drink enough fluid to keep your urine pale yellow. Take small, frequent sips of liquids as needed while experiencing motion sickness. Staying hydrated may help relieve or prevent symptoms. Do not eat large meals before or during travel. When you travel long distances, eat small, bland meals. Do not drink alcohol before or during travel. When riding in a moving vehicle: Sit in an area of the vehicle where the least motion occurs. On an airplane, sit near the wing. Lie back in your seat if possible. On a boat, sit near the middle. In a car, sit in the front seat, not the back seat. Breathe slowly and deeply. Do not read or focus on nearby objects such as your mobile phone. Try watching the horizon or a distant object. This is especially helpful when you travel in a boat. In a car, ride in the front seat and look out the front window. Get some fresh air if possible. For example, open a window when you are riding in a car. General instructions If possible, avoid situations that cause your motion sickness. Do not smoke before or during travel. Avoid areas where people are smoking. Plan ahead for travel. Talk with your health care provider about whether you should use medicines to help   prevent motion sickness. Where to find more information Centers for Disease Control and Prevention: wwwnc.cdc.gov Contact a health care provider if: You faint. Your vomiting or nausea does not go away within 24 hours. You have persistent severe dizziness or light-headedness  when you stand up. You have a fever. Get help right away if: You have trouble breathing. You have severe pain in your abdomen or chest. You have a severe headache. You have trouble speaking. You develop weakness or numbness on one side of your body. These symptoms may represent a serious problem that is an emergency. Do not wait to see if the symptoms will go away. Get medical help right away. Call your local emergency services (911 in the [place] Do not drive yourself to the hospital. Summary Motion sickness is an unpleasant, temporary feeling of dizziness and nausea that occurs when traveling in a moving vehicle. You may also have abdominal pain, sweating, and paleness. The symptoms of motion sickness usually get better after the motion or traveling stops, but symptoms may last for hours or days. Plan ahead for travel. Talk with your health care provider about whether you should use medicines to help prevent motion sickness. This information is not intended to replace advice given to you by your health care provider. Make sure you discuss any questions you have with your health care provider. Document Revised: 02/24/2020 Document Reviewed: 02/02/2020 Elsevier Patient Education  2023 Elsevier Inc.  

## 2021-08-15 NOTE — Progress Notes (Signed)
medi

## 2021-10-06 DIAGNOSIS — M25552 Pain in left hip: Secondary | ICD-10-CM | POA: Diagnosis not present

## 2021-10-06 DIAGNOSIS — M1612 Unilateral primary osteoarthritis, left hip: Secondary | ICD-10-CM | POA: Diagnosis not present

## 2021-11-10 ENCOUNTER — Ambulatory Visit: Payer: BC Managed Care – PPO | Admitting: Family Medicine

## 2021-11-10 ENCOUNTER — Encounter: Payer: Self-pay | Admitting: Family Medicine

## 2021-11-10 VITALS — BP 138/85 | HR 70 | Temp 97.3°F | Ht 64.0 in | Wt 195.2 lb

## 2021-11-10 DIAGNOSIS — E669 Obesity, unspecified: Secondary | ICD-10-CM

## 2021-11-10 DIAGNOSIS — Z01818 Encounter for other preprocedural examination: Secondary | ICD-10-CM

## 2021-11-10 LAB — CBC
Hematocrit: 39.1 % (ref 34.0–46.6)
Hemoglobin: 12.9 g/dL (ref 11.1–15.9)
MCH: 29.5 pg (ref 26.6–33.0)
MCHC: 33 g/dL (ref 31.5–35.7)
MCV: 89 fL (ref 79–97)
Platelets: 263 10*3/uL (ref 150–450)
RBC: 4.38 x10E6/uL (ref 3.77–5.28)
RDW: 13 % (ref 11.7–15.4)
WBC: 7.2 10*3/uL (ref 3.4–10.8)

## 2021-11-10 LAB — CMP14+EGFR
ALT: 14 IU/L (ref 0–32)
AST: 13 IU/L (ref 0–40)
Albumin/Globulin Ratio: 1.4 (ref 1.2–2.2)
Albumin: 4.3 g/dL (ref 3.9–4.9)
Alkaline Phosphatase: 66 IU/L (ref 44–121)
BUN/Creatinine Ratio: 15 (ref 9–23)
BUN: 13 mg/dL (ref 6–24)
Bilirubin Total: 0.4 mg/dL (ref 0.0–1.2)
CO2: 21 mmol/L (ref 20–29)
Calcium: 9.1 mg/dL (ref 8.7–10.2)
Chloride: 101 mmol/L (ref 96–106)
Creatinine, Ser: 0.86 mg/dL (ref 0.57–1.00)
Globulin, Total: 3.1 g/dL (ref 1.5–4.5)
Glucose: 87 mg/dL (ref 70–99)
Potassium: 4.3 mmol/L (ref 3.5–5.2)
Sodium: 138 mmol/L (ref 134–144)
Total Protein: 7.4 g/dL (ref 6.0–8.5)
eGFR: 83 mL/min/{1.73_m2} (ref >59)

## 2021-11-10 LAB — COAGUCHEK XS/INR WAIVED
INR: 1 (ref 0.9–1.1)
Prothrombin Time: 11.4 s

## 2021-11-10 LAB — BAYER DCA HB A1C WAIVED: HB A1C (BAYER DCA - WAIVED): 5.7 % — ABNORMAL HIGH (ref 4.8–5.6)

## 2021-11-10 NOTE — Progress Notes (Signed)
Jamie Odonnell is a 51 y.o. female who is here for preoperative clearance for left total hip replacement with Dr. Almyra Deforest in October.  She is currently scheduled December 26, 2021.  She has quite a bit of difficulty when she very first gets up to start walking.  She has never had problems waking up from anesthesia but has had issues with having insufficient local anesthesia on the left side only.  She had some postop nausea and vomiting after a C-section but does not report any issues with her other surgeries previously.  No problems bending her neck backwards.  No chest pain, shortness of breath, difficulty swallowing.  1) High Risk Cardiac Conditions  1) Recent MI - No.  2) Decompensated Heart Failure - No.  3) Unstable angina - No.  4) Symptomatic arrythmia - No.  5) Sx Valvular Disease - No.  2) Intermediate Risk Factors - DM, CKD, CVA, CHF, CAD - No.  2) Functional Status - > 4 mets YES  3) Surgery Specific Risk -  Intermediate ( Orthopaedic )       4) Further Noninvasive evaluation -   1) EKG - Yes.   No baseline on record.      2) Echo - No.   1) Worsening dyspnea   3) Stress Testing - Active Cardiac Disease - No.  5) Need for medical therapy - Beta Blocker, Statins indicated ? No.  PE: Vitals:   11/10/21 0900  BP: 138/85  Pulse: 70  Temp: (!) 97.3 F (36.3 C)  SpO2: 99%  Body mass index is 33.51 kg/m.  Physical Examination: General appearance - alert, well appearing, and in no distress, oriented to person, place, and time, and obese Mental status - normal mood, behavior, speech, dress, motor activity, and thought processes Mouth - mucous membranes moist, pharynx normal without lesions and Mallampati 2 Neck - supple, no significant adenopathy Chest - clear to auscultation, no wheezes, rales or rhonchi, symmetric air entry Heart - normal rate, regular rhythm, normal S1, S2, no murmurs, rubs, clicks or gallops Musculoskeletal -antalgic gait with visible limp.  Preop  examination - Plan: CMP14+EGFR, CBC, CoaguChek XS/INR Waived, EKG 12-Lead  Obesity (BMI 30.0-34.9) - Plan: Bayer DCA Hb A1c Waived  I have independently evaluated patient.  Jamie Odonnell is a 51 y.o. female who is low risk for an intermediate risk surgery.  There are  modifiable risk factors (obesity).  Jamie Odonnell's RCRI calculation for MACE is: 0.    Herb Beltre M. Lajuana Ripple, [facility]

## 2021-11-10 NOTE — Patient Instructions (Addendum)
You have not had a physical in years.  I highly recommend you schedule a visit for fasting cholesterol labs/ pap smear and other preventative care.    Colon cancer screening starts at age 50 now, so you are also overdue for this.

## 2021-12-26 DIAGNOSIS — M1612 Unilateral primary osteoarthritis, left hip: Secondary | ICD-10-CM | POA: Diagnosis not present

## 2022-08-08 DIAGNOSIS — D1722 Benign lipomatous neoplasm of skin and subcutaneous tissue of left arm: Secondary | ICD-10-CM | POA: Diagnosis not present

## 2022-08-08 DIAGNOSIS — L814 Other melanin hyperpigmentation: Secondary | ICD-10-CM | POA: Diagnosis not present

## 2022-08-08 DIAGNOSIS — D492 Neoplasm of unspecified behavior of bone, soft tissue, and skin: Secondary | ICD-10-CM | POA: Diagnosis not present

## 2022-08-08 DIAGNOSIS — D225 Melanocytic nevi of trunk: Secondary | ICD-10-CM | POA: Diagnosis not present

## 2022-08-08 DIAGNOSIS — L821 Other seborrheic keratosis: Secondary | ICD-10-CM | POA: Diagnosis not present

## 2022-09-25 IMAGING — MR MR HIP*L* W/CM
6 series · 38 of 40 positions shown · IV contrast (agent unspecified)
Comparison: None.

CLINICAL DATA: Left persistent hip pain for 2 months.

EXAM:
MR ARTHROGRAM OF THE LEFT HIP WITHOUT CONTRAST
TECHNIQUE: Multiplanar, multisequence MR imaging of the left hip was performed
following the administration of intra-articular contrast.
CONTRAST:  See Injection Documentation

[Series 9: T2 fat-sat · coronal · left · 3.5mm · 1.04mm/px · 7 of 42 slices shown (1 of 2)]
[im 1/42]
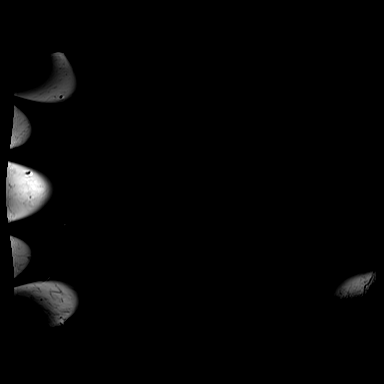
[im 7/42]
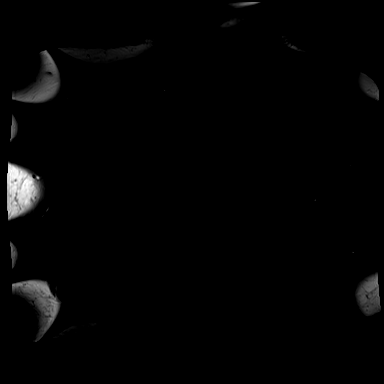
[im 14/42]
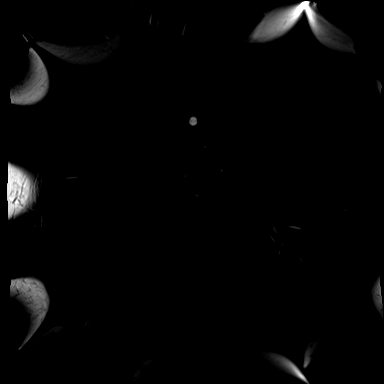
[im 21/42]
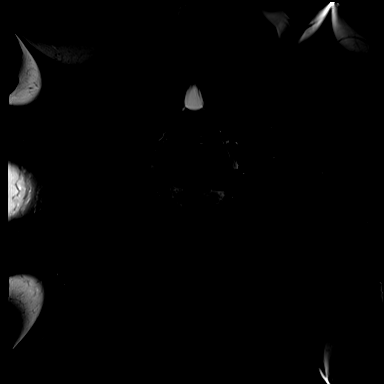
[im 28/42]
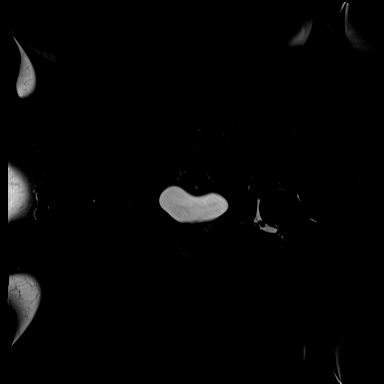
[im 35/42]
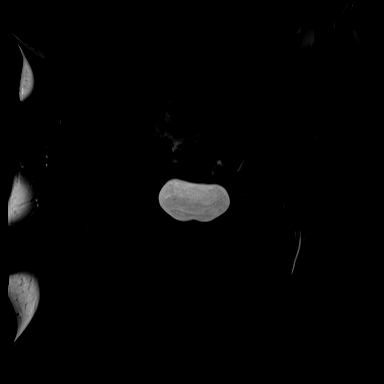
[im 42/42]
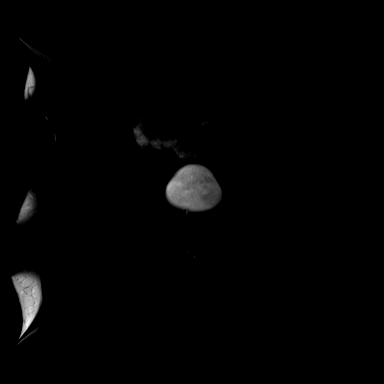

[Series 10: T1 · coronal · left · 3.5mm · 1.04mm/px · 8 of 40 slices shown]
[im 1/40]
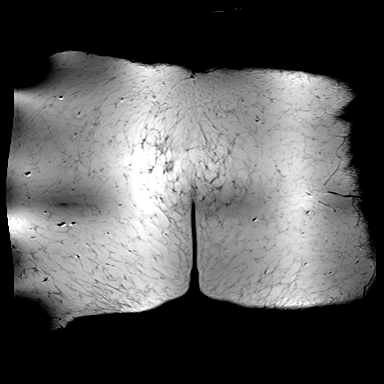
[im 6/40]
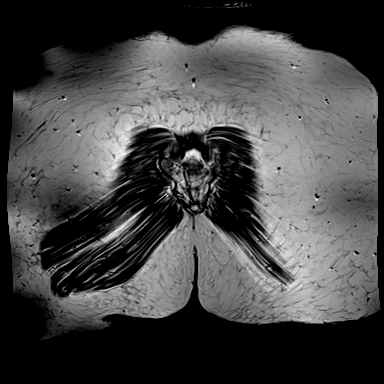
[im 12/40]
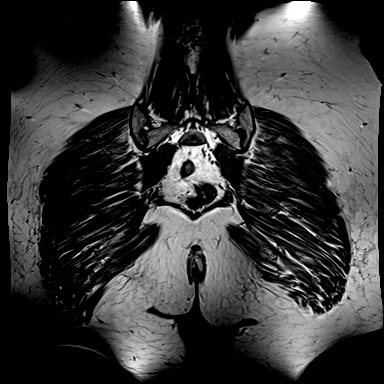
[im 17/40]
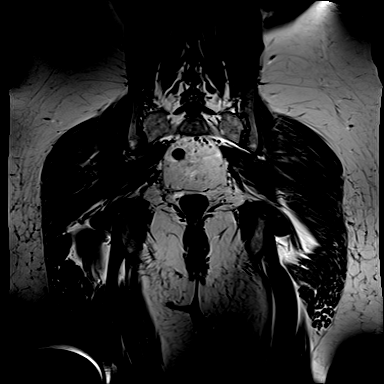
[im 23/40]
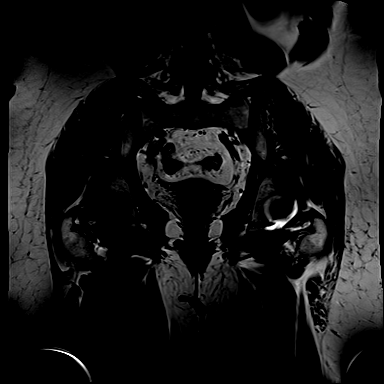
[im 28/40]
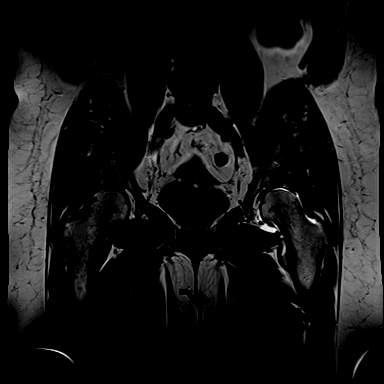
[im 34/40]
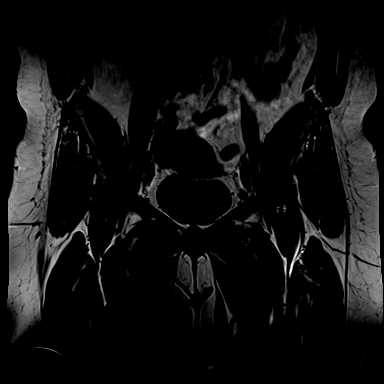
[im 40/40]
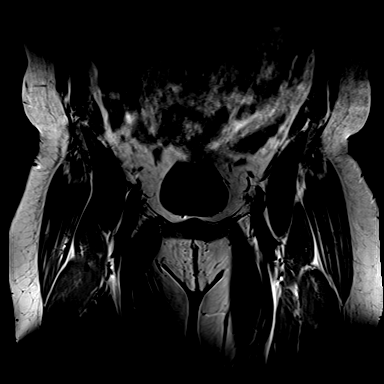

[Series 11: T2 fat-sat · axial · left · 3.0mm · 1.19mm/px · z∈[-65,+54]mm · 7 of 34 slices shown (2 of 2)]
[im 1/34]
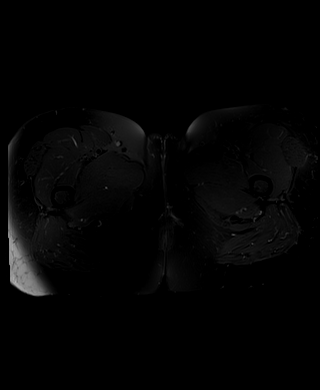
[im 6/34]
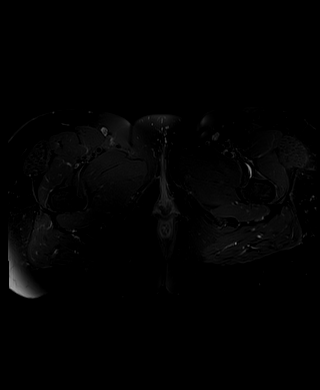
[im 12/34]
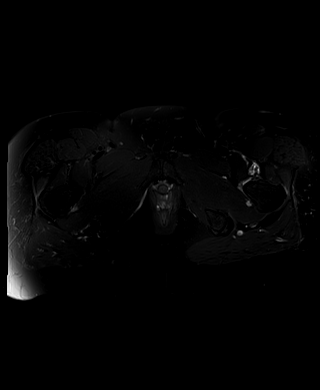
[im 17/34]
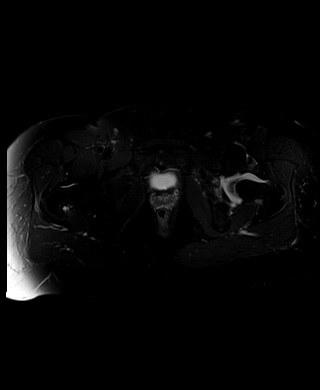
[im 23/34]
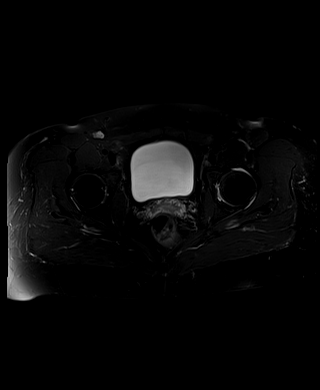
[im 28/34]
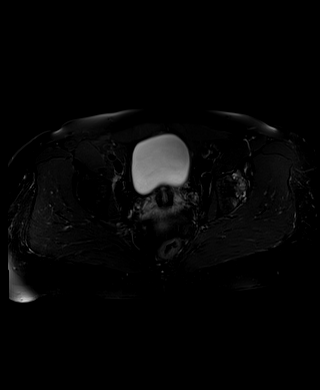
[im 34/34]
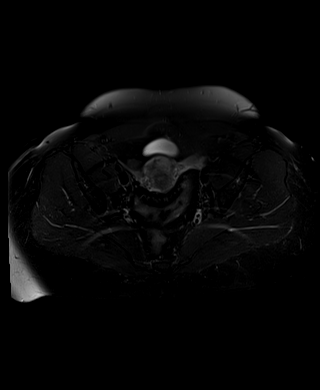

[Series 13: T1 fat-sat · coronal · left · 3.0mm · 0.56mm/px · 5 of 25 slices shown (1 of 3)]
[im 1/25]
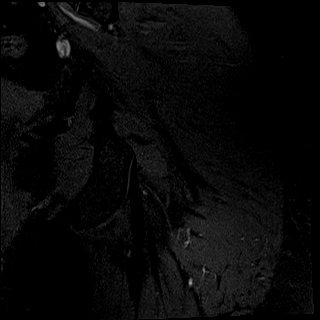
[im 7/25]
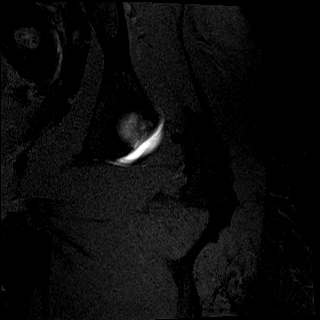
[im 13/25]
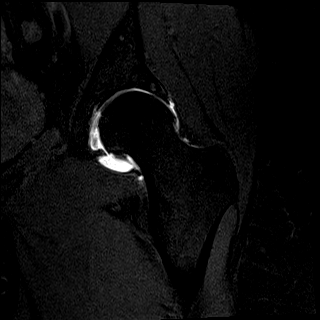
[im 19/25]
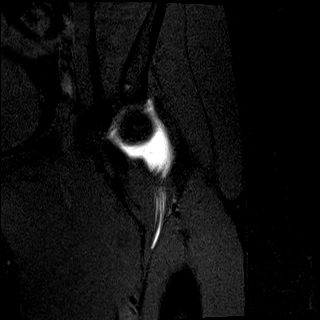
[im 25/25]
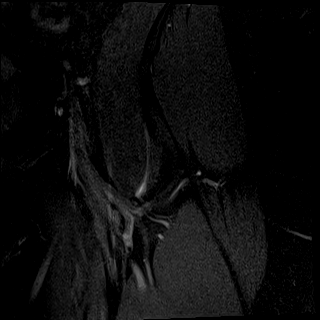

[Series 14: T1 fat-sat · sagittal · left · 3.0mm · 0.56mm/px · 7 of 34 slices shown (2 of 3)]
[im 1/34]
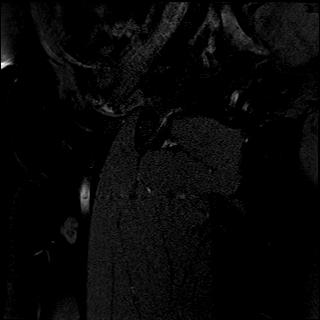
[im 6/34]
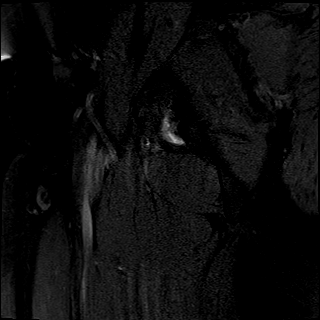
[im 12/34]
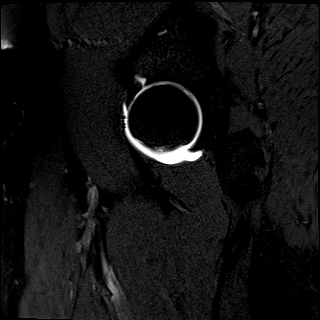
[im 17/34]
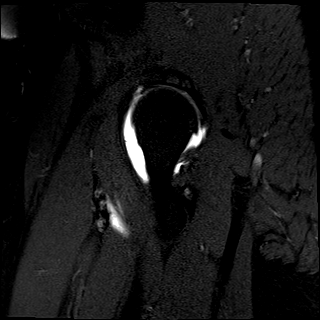
[im 23/34]
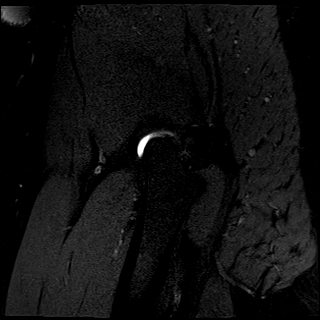
[im 28/34]
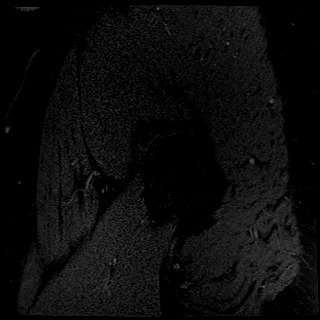
[im 34/34]
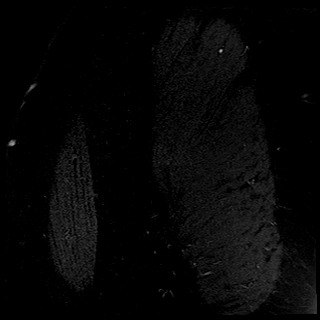

[Series 100: T1 fat-sat · oblique · left · 3.0mm · 0.56mm/px · 4 of 31 slices shown (3 of 3)]
[im 1/31]
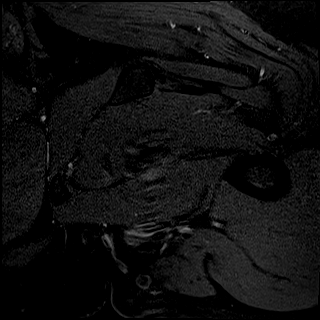
[im 7/31]
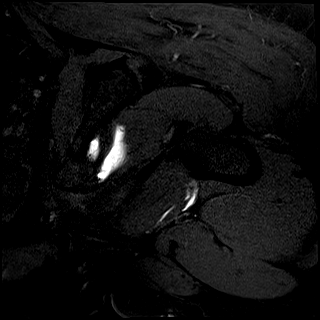
[im 13/31]
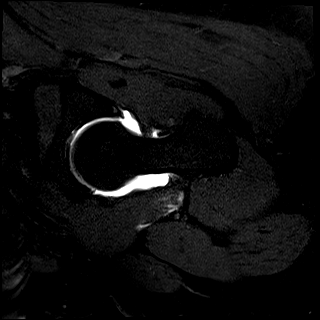
[im 19/31]
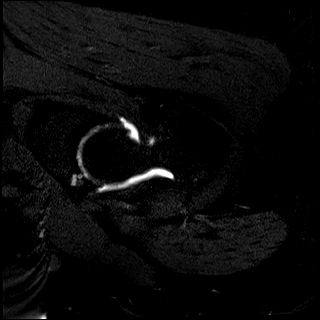

[38 of 40 positions shown; findings below may reference images not displayed]

FINDINGS: Bones: No acute fracture. No dislocation. No femoral head avascular
necrosis. Bony pelvis intact without diastasis. Pubic symphysis
within normal limits. Mild arthropathy of the bilateral SI joints
with mild subchondral marrow signal changes, slightly more prominent
on the left. No SI joint effusion. No erosion. Mild osteoarthritis
of the right hip. Subchondral marrow edema within the left
acetabulum. Elsewhere, no bone marrow edema. No marrow replacing
bone lesion.

Articular cartilage and labrum

Articular cartilage: High-grade cartilage loss along the superior
aspect of the left hip joint with multifocal areas of full-thickness
fissuring. Prominent subchondral bone marrow edema within the
superior acetabulum.

Labrum:  Grossly intact.  No paralabral cyst.

Joint or bursal effusion

Joint effusion: Left hip joint is adequately distended with injected
contrast. There are a few tiny intra-articular loose bodies within
the left hip joint space.

Bursae: No abnormal bursal fluid collection.

Muscles and tendons

Muscles and tendons: The gluteal, hamstring, iliopsoas, rectus
femoris, and adductor tendons appear intact without tear or
significant tendinosis. Normal muscle bulk and signal intensity
without edema, atrophy, or fatty infiltration.

Other findings

Miscellaneous: No soft tissue edema or fluid collection. No inguinal
lymphadenopathy.
IMPRESSION: 1. Moderate-severe osteoarthritis of the left hip with high-grade
cartilage loss along the superior aspect of the left hip joint with
multifocal areas of full-thickness fissuring. Prominent subchondral
bone marrow edema within the superior acetabulum.
2. Mild osteoarthritis of the right hip.
3. Mild arthropathy of the bilateral SI joints.

## 2022-12-14 ENCOUNTER — Other Ambulatory Visit: Payer: Self-pay | Admitting: Family Medicine

## 2022-12-14 DIAGNOSIS — Z1212 Encounter for screening for malignant neoplasm of rectum: Secondary | ICD-10-CM

## 2022-12-14 DIAGNOSIS — Z1211 Encounter for screening for malignant neoplasm of colon: Secondary | ICD-10-CM

## 2022-12-26 ENCOUNTER — Encounter: Payer: Self-pay | Admitting: Family Medicine

## 2022-12-26 DIAGNOSIS — Z1212 Encounter for screening for malignant neoplasm of rectum: Secondary | ICD-10-CM | POA: Diagnosis not present

## 2022-12-26 DIAGNOSIS — Z1211 Encounter for screening for malignant neoplasm of colon: Secondary | ICD-10-CM | POA: Diagnosis not present

## 2023-01-03 LAB — COLOGUARD: COLOGUARD: NEGATIVE

## 2023-01-29 DIAGNOSIS — L578 Other skin changes due to chronic exposure to nonionizing radiation: Secondary | ICD-10-CM | POA: Diagnosis not present

## 2023-01-29 DIAGNOSIS — L218 Other seborrheic dermatitis: Secondary | ICD-10-CM | POA: Diagnosis not present

## 2023-03-20 DIAGNOSIS — L578 Other skin changes due to chronic exposure to nonionizing radiation: Secondary | ICD-10-CM | POA: Diagnosis not present

## 2023-05-07 DIAGNOSIS — H1045 Other chronic allergic conjunctivitis: Secondary | ICD-10-CM | POA: Diagnosis not present

## 2023-06-10 ENCOUNTER — Telehealth: Payer: Self-pay | Admitting: Family Medicine

## 2023-06-10 NOTE — Telephone Encounter (Signed)
 I've not seen this patient in almost 2 years so she will have to have at least a video visit or mychart visit for this since it is a new rx.

## 2023-06-10 NOTE — Telephone Encounter (Signed)
 Copied from CRM [id number]. Topic: Clinical - Medication Question >> Jun 10, 2023 11:43 AM Alessandra Bevels wrote: Reason for CRM: Patient is calling to report that she is going on a 2 week cruise leaving on June 16, 2023 requesting script for motion sickness  [id number] [place] [place] - [address] [place] [address] Phone: [phone] Fax: [phone] Hours: Not open 24 hours

## 2023-06-14 ENCOUNTER — Telehealth: Admitting: Family Medicine

## 2023-06-14 ENCOUNTER — Encounter: Payer: Self-pay | Admitting: Family Medicine

## 2023-06-14 DIAGNOSIS — T753XXA Motion sickness, initial encounter: Secondary | ICD-10-CM

## 2023-06-14 MED ORDER — SCOPOLAMINE 1 MG/3DAYS TD PT72: 1.0000 | MEDICATED_PATCH | TRANSDERMAL | 0 refills | Status: DC

## 2023-06-14 MED ORDER — ONDANSETRON 4 MG PO TBDP: 4.0000 mg | ORAL_TABLET | Freq: Three times a day (TID) | ORAL | 0 refills | Status: AC | PRN

## 2023-06-14 MED ORDER — MECLIZINE HCL 12.5 MG PO TABS
12.5000 mg | ORAL_TABLET | Freq: Three times a day (TID) | ORAL | 0 refills | Status: AC | PRN
Start: 2023-06-14 — End: ?

## 2023-06-14 NOTE — Progress Notes (Signed)
 MyChart Video visit  Subjective: CC: Motion sickness PCP: Raliegh Ip, DO Jamie Odonnell is a 53 y.o. female. Patient provides verbal consent for consult held via video.  Due to COVID-19 pandemic this visit was conducted virtually. This visit type was conducted due to national recommendations for restrictions regarding the COVID-19 Pandemic (e.g. social distancing, sheltering in place) in an effort to limit this patient's exposure and mitigate transmission in our community. All issues noted in this document were discussed and addressed.  A physical exam was not performed with this format.   Location of patient: work Location of provider: [facility] Others present for call: none  1.  Motion sickness She is going on a cruise to [place] x2 weeks.  She will be leaving from [facility] and then traveling to all the [place].  She has had historically motion sickness even just in the car and would like to get her scopolamine patch as this has been the most helpful for her.  ROS: Per HPI  Allergies  Allergen Reactions   Ciprofloxacin Itching   No past medical history on file.  Current Outpatient Medications:    JUNEL 1/20 1-20 MG-MCG tablet, , Disp: , Rfl:   General: Well-appearing female in no acute distress  Assessment/ Plan: 53 y.o. female   Motion sickness, initial encounter - Plan: scopolamine (TRANSDERM-SCOP) 1 MG/3DAYS, ondansetron (ZOFRAN-ODT) 4 MG disintegrating tablet, meclizine (ANTIVERT) 12.5 MG tablet  She will primarily rely on the Transderm scop patch.  However, have given her meclizine and Zofran for any breakthrough symptoms since this is a fairly long journey for her.  Encouraged her to contact me should any other questions or concerns arise in the meantime.  I hope she has a fantastic time in [place]  Start time: 3:25 End time: 3:31pm  Total time spent on patient care (including video visit/ documentation): 7 minutes  Jamie Gause Hulen Skains, DO [facility] [phone]

## 2023-08-01 DIAGNOSIS — C44729 Squamous cell carcinoma of skin of left lower limb, including hip: Secondary | ICD-10-CM | POA: Diagnosis not present

## 2023-08-01 DIAGNOSIS — L538 Other specified erythematous conditions: Secondary | ICD-10-CM | POA: Diagnosis not present

## 2023-08-01 DIAGNOSIS — D225 Melanocytic nevi of trunk: Secondary | ICD-10-CM | POA: Diagnosis not present

## 2023-08-01 DIAGNOSIS — L814 Other melanin hyperpigmentation: Secondary | ICD-10-CM | POA: Diagnosis not present

## 2023-08-01 DIAGNOSIS — L821 Other seborrheic keratosis: Secondary | ICD-10-CM | POA: Diagnosis not present

## 2023-10-29 DIAGNOSIS — L905 Scar conditions and fibrosis of skin: Secondary | ICD-10-CM | POA: Diagnosis not present

## 2023-12-05 DIAGNOSIS — M79672 Pain in left foot: Secondary | ICD-10-CM | POA: Diagnosis not present

## 2023-12-05 DIAGNOSIS — M25561 Pain in right knee: Secondary | ICD-10-CM | POA: Diagnosis not present

## 2023-12-05 DIAGNOSIS — M722 Plantar fascial fibromatosis: Secondary | ICD-10-CM | POA: Diagnosis not present

## 2023-12-05 DIAGNOSIS — M25562 Pain in left knee: Secondary | ICD-10-CM | POA: Diagnosis not present

## 2023-12-20 DIAGNOSIS — L308 Other specified dermatitis: Secondary | ICD-10-CM | POA: Diagnosis not present

## 2024-01-02 DIAGNOSIS — M722 Plantar fascial fibromatosis: Secondary | ICD-10-CM | POA: Diagnosis not present

## 2024-01-02 DIAGNOSIS — M79672 Pain in left foot: Secondary | ICD-10-CM | POA: Diagnosis not present

## 2024-02-04 DIAGNOSIS — L570 Actinic keratosis: Secondary | ICD-10-CM | POA: Diagnosis not present

## 2024-02-04 DIAGNOSIS — L821 Other seborrheic keratosis: Secondary | ICD-10-CM | POA: Diagnosis not present

## 2024-02-04 DIAGNOSIS — L814 Other melanin hyperpigmentation: Secondary | ICD-10-CM | POA: Diagnosis not present

## 2024-02-04 DIAGNOSIS — D225 Melanocytic nevi of trunk: Secondary | ICD-10-CM | POA: Diagnosis not present

## 2024-02-20 ENCOUNTER — Other Ambulatory Visit: Payer: Self-pay | Admitting: Family Medicine

## 2024-02-20 DIAGNOSIS — T753XXA Motion sickness, initial encounter: Secondary | ICD-10-CM

## 2024-02-20 NOTE — Telephone Encounter (Signed)
 Copied from CRM #[id number]. Topic: Clinical - Medication Refill >> Feb 20, 2024  9:30 AM Carrielelia G wrote: Medication: scopolamine  (TRANSDERM-SCOP) 1 MG/3DAYS  Has the patient contacted their pharmacy? No (Agent: If no, request that the patient contact the pharmacy for the refill. If patient does not wish to contact the pharmacy document the reason why and proceed with request.) (Agent: If yes, when and what did the pharmacy advise?)  This is the patient's preferred pharmacy:  [id number] - [place], [place] - [address] [place] [address] Phone: [phone] Fax: [phone]   Is this the correct pharmacy for this prescription? Yes If no, delete pharmacy and type the correct one.    Is the patient out of the medication? Yes  Has the patient been seen for an appointment in the last year OR does the patient have an upcoming appointment? Yes  Can we respond through MyChart? Yes  Agent: Please be advised that Rx refills may take up to 3 business days. We ask that you follow-up with your pharmacy.

## 2024-02-24 ENCOUNTER — Telehealth: Payer: Self-pay

## 2024-02-24 ENCOUNTER — Telehealth: Payer: Self-pay | Admitting: Family Medicine

## 2024-02-24 NOTE — Telephone Encounter (Signed)
 Copied from CRM #[id number]. Topic: Clinical - Medication Refill >> Feb 24, 2024  4:09 PM Miquel SAILOR wrote: Medication: scopolamine  (TRANSDERM-SCOP) 1 MG/3DAYS-PT calling on update for medication let her know takes 3 bus days . She will wait till 12/17. Trip on 12/27. Needs call back for update [phone]

## 2024-02-24 NOTE — Telephone Encounter (Unsigned)
 Copied from CRM #[id number]. Topic: Clinical - Medication Refill >> Feb 20, 2024  9:30 AM Carrielelia G wrote: Medication: scopolamine  (TRANSDERM-SCOP) 1 MG/3DAYS  Has the patient contacted their pharmacy? No (Agent: If no, request that the patient contact the pharmacy for the refill. If patient does not wish to contact the pharmacy document the reason why and proceed with request.) (Agent: If yes, when and what did the pharmacy advise?)  This is the patient's preferred pharmacy:  [id number] - [place], [place] - [address] [place] [address] Phone: [phone] Fax: [phone]   Is this the correct pharmacy for this prescription? Yes If no, delete pharmacy and type the correct one.    Is the patient out of the medication? Yes  Has the patient been seen for an appointment in the last year OR does the patient have an upcoming appointment? Yes  Can we respond through MyChart? Yes  Agent: Please be advised that Rx refills may take up to 3 business days. We ask that you follow-up with your pharmacy. >> Feb 24, 2024  4:09 PM Miquel SAILOR wrote: Medication: scopolamine  (TRANSDERM-SCOP) 1 MG/3DAYS-PT calling on update for medication let her know takes 3 bus days . She will wait till 12/17. Trip on 12/27. Needs call back for update [phone]

## 2024-02-24 NOTE — Telephone Encounter (Signed)
 Attempted to call but there was no answer and no voicemail.  I sent in a 30-day prescription (#10patches to be used every 3 days) of these patches back in April.  She already gone through all of those???  If not, she should have some left over on a refill at her pharmacy.  She may want to check with them

## 2024-02-25 NOTE — Telephone Encounter (Signed)
 Left message for patient to call back

## 2024-02-25 NOTE — Telephone Encounter (Signed)
 LMOVM to get more information on the need for the refill request, gave my direct ph# to call back.

## 2024-02-27 ENCOUNTER — Other Ambulatory Visit: Payer: Self-pay | Admitting: Family Medicine

## 2024-02-27 ENCOUNTER — Encounter: Payer: Self-pay | Admitting: Family Medicine

## 2024-02-27 DIAGNOSIS — T753XXA Motion sickness, initial encounter: Secondary | ICD-10-CM

## 2024-02-27 NOTE — Telephone Encounter (Signed)
Noted  -LS

## 2024-02-27 NOTE — Telephone Encounter (Signed)
Patient contacted pharmacy for refill.

## 2024-02-28 ENCOUNTER — Other Ambulatory Visit: Payer: Self-pay | Admitting: Family Medicine

## 2024-02-28 DIAGNOSIS — T753XXA Motion sickness, initial encounter: Secondary | ICD-10-CM

## 2024-02-28 NOTE — Telephone Encounter (Signed)
 Copied from CRM #[id number]. Topic: Clinical - Medication Refill >> Feb 28, 2024 11:36 AM Edsel HERO wrote: Medication: scopolamine  (TRANSDERM-SCOP) 1 MG/3DAYS  Has the patient contacted their pharmacy? Yes Patient does not have a refill on file at pharmacy. RX that was filled in April has been used or discarded. Please advise.  This is the patient's preferred pharmacy:  [id number] - [place], [place] - [address] [place] [address] Phone: [phone] Fax: [phone]  Is this the correct pharmacy for this prescription? Yes If no, delete pharmacy and type the correct one.   Has the prescription been filled recently? Yes  Is the patient out of the medication? Yes  Has the patient been seen for an appointment in the last year OR does the patient have an upcoming appointment? Yes  Can we respond through MyChart? Yes  Agent: Please be advised that Rx refills may take up to 3 business days. We ask that you follow-up with your pharmacy.

## 2024-03-02 MED ORDER — SCOPOLAMINE 1 MG/3DAYS TD PT72: 1.0000 | MEDICATED_PATCH | TRANSDERMAL | 0 refills | Status: AC
